# Patient Record
Sex: Female | Born: 1955
Health system: Southern US, Community
[De-identification: ages and names within clinical notes are randomized; demographics above are authoritative.]

## PROBLEM LIST (undated history)

## (undated) DIAGNOSIS — F329 Major depressive disorder, single episode, unspecified: Secondary | ICD-10-CM

## (undated) DIAGNOSIS — F32A Depression, unspecified: Secondary | ICD-10-CM

## (undated) DIAGNOSIS — I10 Essential (primary) hypertension: Secondary | ICD-10-CM

## (undated) DIAGNOSIS — F1010 Alcohol abuse, uncomplicated: Secondary | ICD-10-CM

## (undated) DIAGNOSIS — E7800 Pure hypercholesterolemia, unspecified: Secondary | ICD-10-CM

## (undated) HISTORY — PX: TUBAL LIGATION: SHX77

---

## 2000-04-20 ENCOUNTER — Emergency Department (HOSPITAL_COMMUNITY): Admission: EM | Admit: 2000-04-20 | Discharge: 2000-04-20 | Payer: Self-pay | Admitting: Emergency Medicine

## 2001-08-28 ENCOUNTER — Emergency Department (HOSPITAL_COMMUNITY): Admission: EM | Admit: 2001-08-28 | Discharge: 2001-08-28 | Payer: Self-pay | Admitting: Emergency Medicine

## 2014-11-04 ENCOUNTER — Ambulatory Visit
Admission: RE | Admit: 2014-11-04 | Discharge: 2014-11-04 | Disposition: A | Discharge status: Home / Self Care | Payer: 59 | Source: Ambulatory Visit | Attending: Internal Medicine | Admitting: Internal Medicine

## 2014-11-04 ENCOUNTER — Other Ambulatory Visit: Payer: Self-pay | Admitting: Internal Medicine

## 2014-11-04 DIAGNOSIS — R059 Cough, unspecified: Secondary | ICD-10-CM

## 2014-11-04 DIAGNOSIS — R05 Cough: Secondary | ICD-10-CM

## 2014-11-04 DIAGNOSIS — Z1231 Encounter for screening mammogram for malignant neoplasm of breast: Secondary | ICD-10-CM

## 2014-11-11 ENCOUNTER — Other Ambulatory Visit (HOSPITAL_COMMUNITY)
Admission: RE | Admit: 2014-11-11 | Discharge: 2014-11-11 | Disposition: A | Discharge status: Home / Self Care | Payer: 59 | Source: Ambulatory Visit | Attending: Nurse Practitioner | Admitting: Nurse Practitioner

## 2014-11-11 ENCOUNTER — Other Ambulatory Visit: Payer: Self-pay | Admitting: Nurse Practitioner

## 2014-11-11 DIAGNOSIS — Z1151 Encounter for screening for human papillomavirus (HPV): Secondary | ICD-10-CM | POA: Insufficient documentation

## 2014-11-11 DIAGNOSIS — Z01419 Encounter for gynecological examination (general) (routine) without abnormal findings: Secondary | ICD-10-CM | POA: Insufficient documentation

## 2014-11-12 LAB — CYTOLOGY - PAP

## 2014-11-26 ENCOUNTER — Ambulatory Visit: Payer: 59

## 2014-12-15 ENCOUNTER — Ambulatory Visit: Payer: 59

## 2014-12-15 ENCOUNTER — Ambulatory Visit
Admission: RE | Admit: 2014-12-15 | Discharge: 2014-12-15 | Disposition: A | Discharge status: Home / Self Care | Payer: 59 | Source: Ambulatory Visit | Attending: Internal Medicine | Admitting: Internal Medicine

## 2014-12-15 DIAGNOSIS — Z1231 Encounter for screening mammogram for malignant neoplasm of breast: Secondary | ICD-10-CM

## 2014-12-23 ENCOUNTER — Other Ambulatory Visit: Payer: Self-pay | Admitting: Gastroenterology

## 2015-01-18 ENCOUNTER — Encounter (HOSPITAL_COMMUNITY): Payer: Self-pay | Admitting: Emergency Medicine

## 2015-01-18 ENCOUNTER — Emergency Department (HOSPITAL_COMMUNITY): Payer: BLUE CROSS/BLUE SHIELD

## 2015-01-18 ENCOUNTER — Emergency Department (HOSPITAL_COMMUNITY)
Admission: EM | Admit: 2015-01-18 | Discharge: 2015-01-18 | Disposition: A | Discharge status: Home / Self Care | Payer: BLUE CROSS/BLUE SHIELD | Attending: Emergency Medicine | Admitting: Emergency Medicine

## 2015-01-18 DIAGNOSIS — E7800 Pure hypercholesterolemia, unspecified: Secondary | ICD-10-CM | POA: Insufficient documentation

## 2015-01-18 DIAGNOSIS — R51 Headache: Secondary | ICD-10-CM | POA: Insufficient documentation

## 2015-01-18 DIAGNOSIS — Z79899 Other long term (current) drug therapy: Secondary | ICD-10-CM | POA: Diagnosis not present

## 2015-01-18 DIAGNOSIS — I10 Essential (primary) hypertension: Secondary | ICD-10-CM | POA: Diagnosis not present

## 2015-01-18 DIAGNOSIS — R200 Anesthesia of skin: Secondary | ICD-10-CM | POA: Insufficient documentation

## 2015-01-18 DIAGNOSIS — Z8659 Personal history of other mental and behavioral disorders: Secondary | ICD-10-CM | POA: Diagnosis not present

## 2015-01-18 DIAGNOSIS — E785 Hyperlipidemia, unspecified: Secondary | ICD-10-CM | POA: Insufficient documentation

## 2015-01-18 DIAGNOSIS — F17210 Nicotine dependence, cigarettes, uncomplicated: Secondary | ICD-10-CM | POA: Insufficient documentation

## 2015-01-18 DIAGNOSIS — R519 Headache, unspecified: Secondary | ICD-10-CM

## 2015-01-18 HISTORY — DX: Major depressive disorder, single episode, unspecified: F32.9

## 2015-01-18 HISTORY — DX: Pure hypercholesterolemia, unspecified: E78.00

## 2015-01-18 HISTORY — DX: Alcohol abuse, uncomplicated: F10.10

## 2015-01-18 HISTORY — DX: Essential (primary) hypertension: I10

## 2015-01-18 HISTORY — DX: Depression, unspecified: F32.A

## 2015-01-18 LAB — DIFFERENTIAL
Basophils Absolute: 0 10*3/uL (ref 0.0–0.1)
Basophils Relative: 1 %
Eosinophils Absolute: 0 10*3/uL (ref 0.0–0.7)
Eosinophils Relative: 1 %
Lymphocytes Relative: 38 %
Lymphs Abs: 2.8 10*3/uL (ref 0.7–4.0)
Monocytes Absolute: 0.8 10*3/uL (ref 0.1–1.0)
Monocytes Relative: 11 %
Neutro Abs: 3.6 10*3/uL (ref 1.7–7.7)
Neutrophils Relative %: 49 %

## 2015-01-18 LAB — I-STAT TROPONIN, ED: Troponin i, poc: 0.01 ng/mL (ref 0.00–0.08)

## 2015-01-18 LAB — APTT: aPTT: 29 seconds (ref 24–37)

## 2015-01-18 LAB — PROTIME-INR
INR: 1 (ref 0.00–1.49)
Prothrombin Time: 13.4 seconds (ref 11.6–15.2)

## 2015-01-18 LAB — CBC
HCT: 43 % (ref 36.0–46.0)
Hemoglobin: 14.6 g/dL (ref 12.0–15.0)
MCH: 31.7 pg (ref 26.0–34.0)
MCHC: 34 g/dL (ref 30.0–36.0)
MCV: 93.5 fL (ref 78.0–100.0)
Platelets: 291 10*3/uL (ref 150–400)
RBC: 4.6 MIL/uL (ref 3.87–5.11)
RDW: 14.5 % (ref 11.5–15.5)
WBC: 7.3 10*3/uL (ref 4.0–10.5)

## 2015-01-18 LAB — BLOOD GAS, VENOUS
Acid-base deficit: 3.6 mmol/L — ABNORMAL HIGH (ref 0.0–2.0)
Bicarbonate: 19.3 mEq/L — ABNORMAL LOW (ref 20.0–24.0)
O2 Saturation: 78.3 %
Patient temperature: 98.6
TCO2: 16.8 mmol/L (ref 0–100)
pCO2, Ven: 30.5 mmHg — ABNORMAL LOW (ref 45.0–50.0)
pH, Ven: 7.417 — ABNORMAL HIGH (ref 7.250–7.300)
pO2, Ven: 44.3 mmHg (ref 30.0–45.0)

## 2015-01-18 LAB — COMPREHENSIVE METABOLIC PANEL
ALT: 38 U/L (ref 14–54)
AST: 51 U/L — ABNORMAL HIGH (ref 15–41)
Albumin: 4.2 g/dL (ref 3.5–5.0)
Alkaline Phosphatase: 82 U/L (ref 38–126)
Anion gap: 16 — ABNORMAL HIGH (ref 5–15)
BUN: 8 mg/dL (ref 6–20)
CO2: 19 mmol/L — ABNORMAL LOW (ref 22–32)
Calcium: 9.9 mg/dL (ref 8.9–10.3)
Chloride: 102 mmol/L (ref 101–111)
Creatinine, Ser: 0.72 mg/dL (ref 0.44–1.00)
GFR calc Af Amer: >60 mL/min (ref >60)
GFR calc non Af Amer: >60 mL/min (ref >60)
Glucose, Bld: 104 mg/dL — ABNORMAL HIGH (ref 65–99)
Potassium: 3.7 mmol/L (ref 3.5–5.1)
Sodium: 137 mmol/L (ref 135–145)
Total Bilirubin: 1.2 mg/dL (ref 0.3–1.2)
Total Protein: 8 g/dL (ref 6.5–8.1)

## 2015-01-18 LAB — CBG MONITORING, ED: Glucose-Capillary: 82 mg/dL (ref 65–99)

## 2015-01-18 NOTE — ED Provider Notes (Signed)
Complains of occipital headache and posterior neck pain as well as numbness in left arm onset gradually approximate 7 PM yesterday no chest pain no focal weakness no other associated symptoms she is asymptomatic presently. Cardiac risk factors include hypertension, hypercholesterolemia, smoking otherwise negative on exam alert Glasgow Coma Score 15 HEENT exam no facial asymmetry neck supple trachea midline no bruit lungs clear to auscultation heart regular rate and rhythm no murmurs abdomen obese, nontender extremities no edema skin warm dry neurologic. Glasgow Coma Score 15 cranial nerves II through XII grossly intact gait normal Romberg normal pronator drift normal finger to nose normal. DTRs symmetric I laterally knee jerk and ankle jerk and biceps toes downgoing bilaterally No signs of stroke. Negative head CT scan. Don't feel we need further imaging, as patient had headache with symptoms Doubt acute coronary syndrome. In light of no chest pain, normal EKG, negative troponin  Orlie Dakin, MD 01/18/15 1035

## 2015-01-18 NOTE — ED Provider Notes (Signed)
CSN: [id number]     Arrival date & time 01/18/15  0747 History   First MD Initiated Contact with Patient 01/18/15 0756     Chief Complaint  Patient presents with  . Numbness  . Headache   HPI  Ms. Gong is a 60 year old female with PMHx of HTN and hyperlipidemia presenting with headache and unusual sensation in her left arm. She reports onset of the sensation last evening. She states that it is difficult to describe, it "just feels funny". She states that it "sort of" feels heavy or numb. She denies trauma to the arm. Denies weakness of the left upper extremity. She reports going to bed and waking with the sensation still present. Since waking, the sensation has gradually begun to resolve and she states that she feels almost back to baseline. She does note that she has felt the sensation in the past when she had high blood pressure. She is followed by a primary care for control of her blood pressure. She reports a visit with him one month ago for blood pressure management and was supposed schedule a follow-up appointment but never did. She reports compliance with her blood pressure medicines. She also notes waking up with an occipital headache this morning. She states the headache felt like a pressure. The headache has also begun to resolve upon presentation to the emergency department. She did not take any over-the-counter pain medications for this. She denies a headache history. She denies fevers, chills, dizziness, syncope, vision changes, congestion, sore throat, neck stiffness, chest pain, SOB, abdominal pain, nausea, vomiting, extremity weakness or rashes.   Past Medical History  Diagnosis Date  . Hypertension   . High cholesterol   . Depression   . Alcohol abuse    Past Surgical History  Procedure Laterality Date  . Tubal ligation     History reviewed. No pertinent family history. Social History  Substance Use Topics  . Smoking status: Current Every Day Smoker -- 3.00 packs/day for 0  years    Types: Cigarettes  . Smokeless tobacco: None  . Alcohol Use: 100.8 oz/week    168 Cans of beer per week   OB History    No data available     Review of Systems  Constitutional: Negative for fever.  Eyes: Negative for visual disturbance.  Respiratory: Negative for shortness of breath.   Cardiovascular: Negative for chest pain.  Gastrointestinal: Negative for nausea, vomiting and abdominal pain.  Musculoskeletal: Negative for neck stiffness.  Skin: Negative for rash.  Neurological: Positive for numbness and headaches.  All other systems reviewed and are negative.     Allergies  Review of patient's allergies indicates no known allergies.  Home Medications   Prior to Admission medications   Medication Sig Start Date End Date Taking? Authorizing Provider  simvastatin (ZOCOR) 20 MG tablet Take 20 mg by mouth every evening.   Yes Historical Provider, MD  valsartan (DIOVAN) 160 MG tablet Take 160 mg by mouth daily.   Yes Historical Provider, MD   BP 150/84 mmHg  Pulse 92  Temp(Src) 98.4 F (36.9 C) (Oral)  Resp 35  SpO2 92% Physical Exam  Constitutional: She is oriented to person, place, and time. She appears well-developed and well-nourished. No distress.  HENT:  Head: Normocephalic and atraumatic.  Mouth/Throat: Oropharynx is clear and moist. No oropharyngeal exudate.  Eyes: Conjunctivae and EOM are normal. Pupils are equal, round, and reactive to light. Right eye exhibits no discharge. Left eye exhibits no discharge.  No scleral icterus.  Neck: Normal range of motion. Neck supple.  No meningeal signs  Cardiovascular: Normal rate, regular rhythm and normal heart sounds.   Pulmonary/Chest: Effort normal and breath sounds normal. No respiratory distress. She has no wheezes. She has no rales.  Abdominal: Soft. There is no tenderness.  Musculoskeletal: Normal range of motion.  Pt moves all extremities spontaneously and walks with a steady gait  Neurological: She is  alert and oriented to person, place, and time. No cranial nerve deficit. Coordination normal.  Cranial nerves 3-12 intact. Major muscle groups with 5/5 motor strength. Sensation to light touch intact. Coordinated finger to nose. Walks with a steady gait.   Skin: Skin is warm and dry.  Psychiatric: She has a normal mood and affect. Her behavior is normal.  Nursing note and vitals reviewed.   ED Course  Procedures (including critical care time) Labs Review Labs Reviewed  COMPREHENSIVE METABOLIC PANEL - Abnormal; Notable for the following:    CO2 19 (*)    Glucose, Bld 104 (*)    AST 51 (*)    Anion gap 16 (*)    All other components within normal limits  BLOOD GAS, VENOUS - Abnormal; Notable for the following:    pH, Ven 7.417 (*)    pCO2, Ven 30.5 (*)    Bicarbonate 19.3 (*)    Acid-base deficit 3.6 (*)    All other components within normal limits  PROTIME-INR  APTT  CBC  DIFFERENTIAL  I-STAT TROPOININ, ED  CBG MONITORING, ED    Imaging Review Ct Head Wo Contrast  01/18/2015  CLINICAL DATA:  Left arm weakness starting last p.m. EXAM: CT HEAD WITHOUT CONTRAST TECHNIQUE: Contiguous axial images were obtained from the base of the skull through the vertex without intravenous contrast. COMPARISON:  None. FINDINGS: No skull fracture is noted. No intracranial hemorrhage, mass effect or midline shift. Atherosclerotic calcifications of carotid siphon. Mild cerebral atrophy. There is mild periventricular and patchy subcortical white matter decreased attenuation consistent with chronic small vessel ischemic changes. There is no definite evidence of acute cortical infarction. No mass lesion is noted on this unenhanced scan. IMPRESSION: No acute intracranial abnormality. No definite acute cortical infarction. Mild cerebral atrophy. There is mild periventricular and patchy subcortical white matter decreased attenuation probable due to chronic small vessel ischemic changes. Mild atherosclerotic  calcifications of carotid siphon. Electronically Signed   By: Lahoma Crocker M.D.   On: 01/18/2015 10:04   I have personally reviewed and evaluated these images and lab results as part of my medical decision-making.   EKG Interpretation   Date/Time:  Tuesday January 18 2015 08:00:07 EST Ventricular Rate:  96 PR Interval:  187 QRS Duration: 100 QT Interval:  360 QTC Calculation: 455 R Axis:   70 Text Interpretation:  Sinus rhythm Nonspecific T abnormalities, lateral  leads Baseline wander in lead(s) V3 No old tracing to compare Confirmed by  JACUBOWITZ  MD, SAM [id number]) on 01/18/2015 10:15:02 AM      MDM   Final diagnoses:  Headache, unspecified headache type  Numbness   60 yo female presenting with occipital HA and left arm numbness x 1 day. Symptoms have largely resolved upon presentation. Pt hypertensive to 169/105. She reports this is typical of her BP and she is working with her PCP to get it under control. Non-focal neuro exam. Heart RRR. Lungs CTAB. CT negative. Negative troponin and non-ischemic ECG. On re-assessment, pt reports full resolution of her symptoms.  At this time  there does not appear to be any evidence of an acute emergency medical condition and the patient appears stable for discharge with appropriate outpatient follow up. Diagnosis was discussed with patient who verbalizes understanding and is agreeable to discharge. Discussed importance of following up with her PCP to manage her BP and quit smoking. Pt case discussed with Dr. Winfred Leeds who agrees with my plan. Return precautions given in discharge paperwork and discussed with pt at bedside. Pt is stable for discharge.  Filed Vitals:   01/18/15 0757 01/18/15 1027 01/18/15 1030 01/18/15 1210  BP: 184/106 169/105  150/84  Pulse: 94  88 92  Temp: 98.4 F (36.9 C)     TempSrc: Oral     Resp: 18  18 35  SpO2: 100%  100% 92%       Josephina Gip, PA-C 01/18/15 Geraldine, MD 01/19/15 [phone]

## 2015-01-18 NOTE — ED Notes (Signed)
Pt states she felt "kind of funny," with some tingling in her left arm. Woke up today with a headache and worsening tingling of the arm. Denies CP, SOB, N/V/D. Able to feel both arms equally, states some of the numbness and headache has subsided at this time.

## 2015-01-18 NOTE — Discharge Instructions (Signed)
Call your PCP (Dr. Lysle Rubens) for a follow up visit. Your blood pressure was high today and it is importance to get it rechecked by your PCP for possible medication changes.    General Headache Without Cause A headache is pain or discomfort felt around the head or neck area. The specific cause of a headache may not be found. There are many causes and types of headaches. A few common ones are:  Tension headaches.  Migraine headaches.  Cluster headaches.  Chronic daily headaches. HOME CARE INSTRUCTIONS  Watch your condition for any changes. Take these steps to help with your condition: Managing Pain  Take over-the-counter and prescription medicines only as told by your health care provider.  Lie down in a dark, quiet room when you have a headache.  If directed, apply ice to the head and neck area:  Put ice in a plastic bag.  Place a towel between your skin and the bag.  Leave the ice on for 20 minutes, 2-3 times per day.  Use a heating pad or hot shower to apply heat to the head and neck area as told by your health care provider.  Keep lights dim if bright lights bother you or make your headaches worse. Eating and Drinking  Eat meals on a regular schedule.  Limit alcohol use.  Decrease the amount of caffeine you drink, or stop drinking caffeine. General Instructions  Keep all follow-up visits as told by your health care provider. This is important.  Keep a headache journal to help find out what may trigger your headaches. For example, write down:  What you eat and drink.  How much sleep you get.  Any change to your diet or medicines.  Try massage or other relaxation techniques.  Limit stress.  Sit up straight, and do not tense your muscles.  Do not use tobacco products, including cigarettes, chewing tobacco, or e-cigarettes. If you need help quitting, ask your health care provider.  Exercise regularly as told by your health care provider.  Sleep on a regular  schedule. Get 7-9 hours of sleep, or the amount recommended by your health care provider. SEEK MEDICAL CARE IF:   Your symptoms are not helped by medicine.  You have a headache that is different from the usual headache.  You have nausea or you vomit.  You have a fever. SEEK IMMEDIATE MEDICAL CARE IF:   Your headache becomes severe.  You have repeated vomiting.  You have a stiff neck.  You have a loss of vision.  You have problems with speech.  You have pain in the eye or ear.  You have muscular weakness or loss of muscle control.  You lose your balance or have trouble walking.  You feel faint or pass out.  You have confusion.   This information is not intended to replace advice given to you by your health care provider. Make sure you discuss any questions you have with your health care provider.   Document Released: 12/25/2004 Document Revised: 09/15/2014 Document Reviewed: 04/19/2014 Elsevier Interactive Patient Education [employer].

## 2015-01-18 NOTE — ED Provider Notes (Signed)
counciled pt for 5 minutes on smoking cessation  Orlie Dakin, MD 01/18/15 1041

## 2015-02-11 ENCOUNTER — Encounter (HOSPITAL_COMMUNITY): Payer: Self-pay | Admitting: *Deleted

## 2015-02-22 ENCOUNTER — Ambulatory Visit (HOSPITAL_COMMUNITY)
Admission: RE | Admit: 2015-02-22 | Payer: BLUE CROSS/BLUE SHIELD | Source: Ambulatory Visit | Admitting: Gastroenterology

## 2015-02-22 SURGERY — COLONOSCOPY WITH PROPOFOL
Anesthesia: Monitor Anesthesia Care

## 2015-08-12 ENCOUNTER — Other Ambulatory Visit: Payer: Self-pay | Admitting: Gastroenterology

## 2015-09-16 ENCOUNTER — Encounter (HOSPITAL_COMMUNITY): Payer: Self-pay | Admitting: *Deleted

## 2015-09-19 ENCOUNTER — Other Ambulatory Visit: Payer: Self-pay | Admitting: Gastroenterology

## 2015-09-19 NOTE — Anesthesia Preprocedure Evaluation (Addendum)
Anesthesia Evaluation  Patient identified by MRN, date of birth, ID band Patient awake    Reviewed: Allergy & Precautions, NPO status , Patient's Chart, lab work & pertinent test results  Airway Mallampati: II  TM Distance: >3 FB     Dental   Pulmonary Current Smoker,    breath sounds clear to auscultation       Cardiovascular hypertension, Pt. on medications  Rhythm:Regular Rate:Normal     Neuro/Psych Depression negative neurological ROS     GI/Hepatic negative GI ROS, Neg liver ROS,   Endo/Other  negative endocrine ROS  Renal/GU negative Renal ROS     Musculoskeletal   Abdominal   Peds  Hematology negative hematology ROS (+)   Anesthesia Other Findings   Reproductive/Obstetrics                            Anesthesia Physical Anesthesia Plan  ASA: II  Anesthesia Plan: MAC   Post-op Pain Management:    Induction: Intravenous  Airway Management Planned: Natural Airway and Nasal Cannula  Additional Equipment:   Intra-op Plan:   Post-operative Plan:   Informed Consent: I have reviewed the patients History and Physical, chart, labs and discussed the procedure including the risks, benefits and alternatives for the proposed anesthesia with the patient or authorized representative who has indicated his/her understanding and acceptance.     Plan Discussed with:   Anesthesia Plan Comments:        Anesthesia Quick Evaluation

## 2015-09-20 ENCOUNTER — Ambulatory Visit (HOSPITAL_COMMUNITY)
Admission: RE | Admit: 2015-09-20 | Discharge: 2015-09-20 | Disposition: A | Discharge status: Home / Self Care | Payer: BLUE CROSS/BLUE SHIELD | Source: Ambulatory Visit | Attending: Gastroenterology | Admitting: Gastroenterology

## 2015-09-20 ENCOUNTER — Encounter (HOSPITAL_COMMUNITY)
Admission: RE | Disposition: A | Discharge status: Home / Self Care | Payer: Self-pay | Source: Ambulatory Visit | Attending: Gastroenterology

## 2015-09-20 ENCOUNTER — Ambulatory Visit (HOSPITAL_COMMUNITY): Payer: BLUE CROSS/BLUE SHIELD | Admitting: Anesthesiology

## 2015-09-20 ENCOUNTER — Encounter (HOSPITAL_COMMUNITY): Payer: Self-pay

## 2015-09-20 DIAGNOSIS — F17210 Nicotine dependence, cigarettes, uncomplicated: Secondary | ICD-10-CM | POA: Diagnosis not present

## 2015-09-20 DIAGNOSIS — D120 Benign neoplasm of cecum: Secondary | ICD-10-CM | POA: Insufficient documentation

## 2015-09-20 DIAGNOSIS — K635 Polyp of colon: Secondary | ICD-10-CM | POA: Diagnosis not present

## 2015-09-20 DIAGNOSIS — E7800 Pure hypercholesterolemia, unspecified: Secondary | ICD-10-CM | POA: Insufficient documentation

## 2015-09-20 DIAGNOSIS — Z1211 Encounter for screening for malignant neoplasm of colon: Secondary | ICD-10-CM | POA: Insufficient documentation

## 2015-09-20 DIAGNOSIS — I10 Essential (primary) hypertension: Secondary | ICD-10-CM | POA: Diagnosis not present

## 2015-09-20 HISTORY — PX: COLONOSCOPY WITH PROPOFOL: SHX5780

## 2015-09-20 SURGERY — COLONOSCOPY WITH PROPOFOL
Anesthesia: Monitor Anesthesia Care

## 2015-09-20 MED ORDER — PROPOFOL 10 MG/ML IV BOLUS
INTRAVENOUS | Status: AC
  Filled 2015-09-20: qty 20

## 2015-09-20 MED ORDER — PROPOFOL 500 MG/50ML IV EMUL
INTRAVENOUS | Status: DC | PRN
  Administered 2015-09-20: 50 ug/kg/min via INTRAVENOUS

## 2015-09-20 MED ORDER — SODIUM CHLORIDE 0.9 % IV SOLN: INTRAVENOUS | Status: DC

## 2015-09-20 MED ORDER — LACTATED RINGERS IV SOLN
INTRAVENOUS | Status: DC
  Administered 2015-09-20: 1000 mL via INTRAVENOUS

## 2015-09-20 MED ORDER — PROPOFOL 10 MG/ML IV BOLUS
INTRAVENOUS | Status: AC
  Filled 2015-09-20: qty 40

## 2015-09-20 MED ORDER — ONDANSETRON HCL 4 MG/2ML IJ SOLN
INTRAMUSCULAR | Status: AC
Start: 2015-09-20 — End: 2015-09-20
  Filled 2015-09-20: qty 2

## 2015-09-20 MED ORDER — ONDANSETRON HCL 4 MG/2ML IJ SOLN
INTRAMUSCULAR | Status: DC | PRN
  Administered 2015-09-20: 4 mg via INTRAVENOUS

## 2015-09-20 MED ORDER — LIDOCAINE 2% (20 MG/ML) 5 ML SYRINGE
INTRAMUSCULAR | Status: AC
Start: 2015-09-20 — End: 2015-09-20
  Filled 2015-09-20: qty 5

## 2015-09-20 SURGICAL SUPPLY — 22 items

## 2015-09-20 NOTE — Anesthesia Procedure Notes (Signed)
Procedure Name: MAC Date/Time: 09/20/2015 7:30 AM Performed by: West Pugh Pre-anesthesia Checklist: Patient identified, Timeout performed, Emergency Drugs available, Suction available and Patient being monitored Patient Re-evaluated:Patient Re-evaluated prior to inductionOxygen Delivery Method: Simple face mask Dental Injury: Teeth and Oropharynx as per pre-operative assessment

## 2015-09-20 NOTE — H&P (Signed)
  Procedure: Baseline screening colonoscopy  History: The patient is a 60 year old female born 04/08/1955. She is scheduled to undergo her first screening colonoscopy with polypectomy to prevent colon cancer. There is no family history of colon cancer.  Medication allergies: Penicillin caused itching. Lisinopril caused nausea.  Past medical history: Bilateral tubal ligation. Hypertension. Hypercholesterolemia.  Habits: The patient currently smokes cigarettes.  Exam: The patient is alert and lying comfortably on the endoscopy stretcher. Abdomen is soft and nontender to palpation. Lungs are clear to auscultation. Cardiac exam reveals a regular rhythm.  Plan: Proceed with screening colonoscopy

## 2015-09-20 NOTE — Discharge Instructions (Signed)

## 2015-09-20 NOTE — Anesthesia Postprocedure Evaluation (Signed)
Anesthesia Post Note  Patient: Tiffany Frye  Procedure(s) Performed: Procedure(s) (LRB): COLONOSCOPY WITH PROPOFOL (N/A)  Patient location during evaluation: PACU Anesthesia Type: MAC Level of consciousness: awake and alert Pain management: pain level controlled Vital Signs Assessment: post-procedure vital signs reviewed and stable Respiratory status: spontaneous breathing, nonlabored ventilation, respiratory function stable and patient connected to nasal cannula oxygen Cardiovascular status: stable and blood pressure returned to baseline Anesthetic complications: no    Last Vitals:  Vitals:   09/20/15 0806 09/20/15 0820  BP: 133/72 (!) 145/83  Pulse: 83   Resp: (!) 29   Temp: 36.7 C     Last Pain:  Vitals:   09/20/15 0620  TempSrc: Leonard Schwartz

## 2015-09-20 NOTE — Transfer of Care (Signed)
Immediate Anesthesia Transfer of Care Note  Patient: Tiffany Frye  Procedure(s) Performed: Procedure(s): COLONOSCOPY WITH PROPOFOL (N/A)  Patient Location: PACU  Anesthesia Type:MAC  Level of Consciousness:  sedated, patient cooperative and responds to stimulation  Airway & Oxygen Therapy:Patient Spontanous Breathing and Patient connected to face mask oxgen  Post-op Assessment:  Report given to PACU RN and Post -op Vital signs reviewed and stable  Post vital signs:  Reviewed and stable  Last Vitals:  Vitals:   09/20/15 0620 09/20/15 0806  BP: (!) 146/63 133/72  Pulse: 90 83  Resp: 14 (!) 29  Temp: 36.9 C [id number] C    Complications: No apparent anesthesia complications

## 2015-09-20 NOTE — Op Note (Signed)
[facility] Patient Name: Tiffany Frye Procedure Date: 09/20/2015 MRN: [id number] Attending MD: Garlan Fair , MD Date of Birth: 26-Jun-1955 CSN: [id number] Age: 60 Admit Type: Outpatient Procedure:                Colonoscopy Indications:              Screening for colorectal malignant neoplasm Providers:                Garlan Fair, MD, Elmer Ramp. Hinson, RN, [employer], Technician, [employer], CRNA, Rhae Lerner, CRNA Referring MD:              Medicines:                Propofol per Anesthesia Complications:            No immediate complications. Estimated Blood Loss:     Estimated blood loss: none. Procedure:                Pre-Anesthesia Assessment:                           - Prior to the procedure, a History and Physical                            was performed, and patient medications and                            allergies were reviewed. The patient's tolerance of                            previous anesthesia was also reviewed. The risks                            and benefits of the procedure and the sedation                            options and risks were discussed with the patient.                            All questions were answered, and informed consent                            was obtained. Prior Anticoagulants: The patient has                            taken no previous anticoagulant or antiplatelet                            agents. ASA Grade Assessment: II - A patient with  mild systemic disease. After reviewing the risks                            and benefits, the patient was deemed in                            satisfactory condition to undergo the procedure.                           After obtaining informed consent, the colonoscope                            was passed under direct vision. Throughout the                            procedure, the  patient's blood pressure, pulse, and                            oxygen saturations were monitored continuously. The                            was introduced through the anus and advanced to the                            the cecum, identified by appendiceal orifice and                            ileocecal valve. The colonoscopy was somewhat                            difficult due to significant looping. The patient                            tolerated the procedure well. The quality of the                            bowel preparation was good. The ileocecal valve,                            the appendiceal orifice and the rectum were                            photographed. Scope In: 7:36:20 AM Scope Out: 7:58:18 AM Scope Withdrawal Time: 0 hours 12 minutes 23 seconds  Total Procedure Duration: 0 hours 21 minutes 58 seconds  Findings:      The perianal and digital rectal examinations were normal.      A 3 mm polyp was found in the cecum. The polyp was sessile. The polyp       was removed with a cold biopsy forceps. Resection and retrieval were       complete.      A 5 mm polyp was found in the proximal descending colon. The polyp was       sessile. The polyp was removed with a cold snare. Resection and  retrieval were complete.      The exam was otherwise without abnormality. Impression:               - One 3 mm polyp in the cecum, removed with a cold                            biopsy forceps. Resected and retrieved.                           - One 5 mm polyp in the proximal descending colon,                            removed with a cold snare. Resected and retrieved.                           - The examination was otherwise normal. Moderate Sedation:      N/A- Per Anesthesia Care Recommendation:           - Patient has a contact number available for                            emergencies. The signs and symptoms of potential                            delayed complications  were discussed with the                            patient. Return to normal activities tomorrow.                            Written discharge instructions were provided to the                            patient.                           - Repeat colonoscopy date to be determined after                            pending pathology results are reviewed for                            surveillance.                           - Resume previous diet.                           - Continue present medications. Procedure Code(s):        --- Professional ---                           [phone], Colonoscopy, flexible; with removal of                            tumor(s), polyp(s), or other lesion(s)  by snare                            technique                           45380, 59, Colonoscopy, flexible; with biopsy,                            single or multiple Diagnosis Code(s):        --- Professional ---                           Z12.11, Encounter for screening for malignant                            neoplasm of colon                           D12.0, Benign neoplasm of cecum                           D12.4, Benign neoplasm of descending colon CPT copyright 2016 American Medical Association. All rights reserved. The codes documented in this report are preliminary and upon coder review may  be revised to meet current compliance requirements. Earle Gell, MD Garlan Fair, MD 09/20/2015 8:05:38 AM This report has been signed electronically. Number of Addenda: 0

## 2015-10-05 ENCOUNTER — Emergency Department (HOSPITAL_COMMUNITY): Payer: BLUE CROSS/BLUE SHIELD

## 2015-10-05 ENCOUNTER — Encounter (HOSPITAL_COMMUNITY): Payer: Self-pay | Admitting: *Deleted

## 2015-10-05 ENCOUNTER — Emergency Department (HOSPITAL_COMMUNITY)
Admission: EM | Admit: 2015-10-05 | Discharge: 2015-10-05 | Disposition: A | Discharge status: Home / Self Care | Payer: BLUE CROSS/BLUE SHIELD | Attending: Emergency Medicine | Admitting: Emergency Medicine

## 2015-10-05 DIAGNOSIS — F17210 Nicotine dependence, cigarettes, uncomplicated: Secondary | ICD-10-CM | POA: Insufficient documentation

## 2015-10-05 DIAGNOSIS — Z79899 Other long term (current) drug therapy: Secondary | ICD-10-CM | POA: Insufficient documentation

## 2015-10-05 DIAGNOSIS — R05 Cough: Secondary | ICD-10-CM | POA: Diagnosis present

## 2015-10-05 DIAGNOSIS — J069 Acute upper respiratory infection, unspecified: Secondary | ICD-10-CM

## 2015-10-05 DIAGNOSIS — I10 Essential (primary) hypertension: Secondary | ICD-10-CM | POA: Insufficient documentation

## 2015-10-05 DIAGNOSIS — R059 Cough, unspecified: Secondary | ICD-10-CM

## 2015-10-05 MED ORDER — BENZONATATE 100 MG PO CAPS: 100.0000 mg | ORAL_CAPSULE | Freq: Three times a day (TID) | ORAL | 0 refills | Status: DC

## 2015-10-05 NOTE — ED Notes (Signed)
Patient transported to X-ray 

## 2015-10-05 NOTE — ED Triage Notes (Addendum)
Patient c/o cough productive of yellow sputum and nasal congestion x2 weeks.  Patient states cough is worse upon waking.  Patient also c/o post tussive emesis.  Patient denies diarrhea and fever.  Patient denies sinus pain, sore throat and ear ache.  Patient has not treated s/s with OTC medications.

## 2015-10-05 NOTE — ED Provider Notes (Signed)
[facility] DEPT Provider Note   CSN: [id number] Arrival date & time: 10/05/15  1300  By signing my name below, I, Jeanell Sparrow, attest that this documentation has been prepared under the direction and in the presence of non-physician practitioner, Janetta Hora, PA-C. Electronically Signed: Jeanell Sparrow, Scribe. 10/05/2015. 2:08 PM.  History   Chief Complaint Chief Complaint  Patient presents with  . Cough  . Nasal Congestion   The history is provided by the patient. No language interpreter was used.   HPI Comments: Tiffany Frye is a 60 y.o. female who presents to the Emergency Department complaining of intermittent moderate cough that started about a couple of weeks ago. Pt states the gradually worsening cough is productive of yellow sputum. Took dayquil 3 days ago with some relief. Associated symptoms include  fever (last week which has resolved), fatigue, appetite loss, nasal congestion/rhinorrhea, headache, mild SOB, post-tussive emesis. Pt's temperature in the ED today was 97.9. She has a hx of daily smoking. Reports no sick contacts or recent travel. Denies any wheezing, ear pain, sore throat, sinus pain, or abdominal pain.   Past Medical History:  Diagnosis Date  . Alcohol abuse   . Depression   . High cholesterol   . Hypertension     There are no active problems to display for this patient.   Past Surgical History:  Procedure Laterality Date  . COLONOSCOPY WITH PROPOFOL N/A 09/20/2015   Procedure: COLONOSCOPY WITH PROPOFOL;  Surgeon: Garlan Fair, MD;  Location: [facility];  Service: Endoscopy;  Laterality: N/A;  . TUBAL LIGATION      OB History    No data available       Home Medications    Prior to Admission medications   Medication Sig Start Date End Date Taking? Authorizing Provider  simvastatin (ZOCOR) 20 MG tablet Take 20 mg by mouth every evening.    Historical Provider, MD  valsartan (DIOVAN) 160 MG tablet Take 160 mg by mouth daily.     Historical Provider, MD    Family History No family history on file.  Social History Social History  Substance Use Topics  . Smoking status: Current Every Day Smoker    Packs/day: 3.00    Years: 0.00    Types: Cigarettes  . Smokeless tobacco: Never Used  . Alcohol use 100.8 oz/week    168 Cans of beer per week     Comment: decrease to 2-3 beers per week     Allergies   Penicillins   Review of Systems Review of Systems  Constitutional: Positive for appetite change and fatigue. Negative for fever.  HENT: Positive for congestion (Nasal). Negative for ear pain, sinus pressure and sore throat.   Respiratory: Positive for cough and shortness of breath. Negative for wheezing.   Gastrointestinal: Positive for vomiting (Post-tussive). Negative for abdominal pain.  Neurological: Positive for headaches. Negative for dizziness and light-headedness.     Physical Exam Updated Vital Signs BP 111/67 (BP Location: Right Arm)   Pulse 75   Temp 97.9 F (36.6 C) (Oral)   Resp 18   Ht 5\' 3"  (1.6 m)   Wt 152 lb (68.9 kg)   SpO2 99%   BMI 26.93 kg/m   Physical Exam  Constitutional: She is oriented to person, place, and time. She appears well-developed and well-nourished. No distress.  HENT:  Head: Normocephalic and atraumatic.  Right Ear: Hearing, tympanic membrane, external ear and ear canal normal.  Left Ear: Hearing, tympanic membrane, external ear and ear  canal normal.  Nose: Rhinorrhea present. Right sinus exhibits no maxillary sinus tenderness and no frontal sinus tenderness. Left sinus exhibits no maxillary sinus tenderness and no frontal sinus tenderness.  Mouth/Throat: Uvula is midline and mucous membranes are normal. No oropharyngeal exudate, posterior oropharyngeal edema, posterior oropharyngeal erythema or tonsillar abscesses.  Eyes: Conjunctivae are normal. Pupils are equal, round, and reactive to light. Right eye exhibits no discharge. Left eye exhibits no discharge. No  scleral icterus.  Neck: Normal range of motion. Neck supple.  Cardiovascular: Normal rate and regular rhythm.  Exam reveals no gallop and no friction rub.   No murmur heard. Pulmonary/Chest: Effort normal. No respiratory distress. She has no decreased breath sounds. She has no wheezes. She has rhonchi. She has no rales. She exhibits no tenderness.  Scattered rhonchi  Abdominal: Soft. She exhibits no distension.  Musculoskeletal: She exhibits no edema.  Neurological: She is alert and oriented to person, place, and time.  Skin: Skin is warm and dry.  Psychiatric: She has a normal mood and affect. Her behavior is normal.  Nursing note and vitals reviewed.    ED Treatments / Results  DIAGNOSTIC STUDIES: Oxygen Saturation is 99% on RA, normal by my interpretation.    COORDINATION OF CARE: 2:12 PM- Pt advised of plan for treatment and pt agrees.  Labs (all labs ordered are listed, but only abnormal results are displayed) Labs Reviewed - No data to display  EKG  EKG Interpretation None       Radiology Dg Chest 2 View  Result Date: 10/05/2015 CLINICAL DATA:  60 year old presenting with 2 week history of productive cough and sinonasal congestion. More recent onset of nausea and fever. EXAM: CHEST  2 VIEW COMPARISON:  11/04/2014. FINDINGS: Cardiac silhouette normal in size, unchanged. Thoracic aorta mildly atherosclerotic, unchanged. Hilar and mediastinal contours otherwise unremarkable. Lungs clear. Bronchovascular markings normal. Pulmonary vascularity normal. No visible pleural effusions. No pneumothorax. Mild degenerative changes involving the thoracic spine. No interval change. IMPRESSION: 1.  No acute cardiopulmonary disease.  Stable examination. 2. Mild thoracic aortic atherosclerosis. Electronically Signed   By: Evangeline Dakin M.D.   On: 10/05/2015 15:03    Procedures Procedures (including critical care time)  Medications Ordered in ED Medications - No data to  display   Initial Impression / Assessment and Plan / ED Course  I have reviewed the triage vital signs and the nursing notes.  Pertinent labs & imaging results that were available during my care of the patient were reviewed by me and considered in my medical decision making (see chart for details).  Clinical Course   60 year old female presents with URI symptoms and ongoing cough. Patient is afebrile, not tachycardic or tachypneic, and not hypoxic. BP is on the low side however patient denies lightheadedness/LOC. CXR negative. Will d/c with Tessalon and advised to continue supportive care measures at home. Also advised smoking cessation or at least to cut back. PCP follow up advised. Patient is NAD, non-toxic, with stable VS. Patient is informed of clinical course, understands medical decision making process, and agrees with plan. Opportunity for questions provided and all questions answered. Return precautions given.   Final Clinical Impressions(s) / ED Diagnoses   Final diagnoses:  URI (upper respiratory infection)  Cough    New Prescriptions Discharge Medication List as of 10/05/2015  3:17 PM    START taking these medications   Details  benzonatate (TESSALON) 100 MG capsule Take 1 capsule (100 mg total) by mouth every 8 (eight)  hours., Starting Wed 10/05/2015, Print       I personally performed the services described in this documentation, which was scribed in my presence. The recorded information has been reviewed and is accurate.     Recardo Evangelist, PA-C 10/05/15 1603    Carmin Muskrat, MD 10/05/15 [phone]

## 2015-10-05 NOTE — ED Notes (Signed)
Discharge instructions, follow up care, and rx x1 reviewed with patient. Patient verbalized understanding. 

## 2015-10-05 NOTE — Discharge Instructions (Signed)
Please continue to take over the counter cold and flu medicine as needed. You can take the cough medicine as well. Try to cut back on smoking as much as possible to help with your cough.

## 2015-10-05 NOTE — ED Notes (Signed)
Patient given sprite and [place] sandwich per PA request.

## 2015-12-27 ENCOUNTER — Encounter (HOSPITAL_COMMUNITY): Payer: Self-pay

## 2015-12-27 ENCOUNTER — Emergency Department (HOSPITAL_COMMUNITY)
Admission: EM | Admit: 2015-12-27 | Discharge: 2015-12-27 | Disposition: A | Discharge status: Home / Self Care | Payer: BLUE CROSS/BLUE SHIELD | Attending: Emergency Medicine | Admitting: Emergency Medicine

## 2015-12-27 DIAGNOSIS — R55 Syncope and collapse: Secondary | ICD-10-CM | POA: Insufficient documentation

## 2015-12-27 DIAGNOSIS — F17210 Nicotine dependence, cigarettes, uncomplicated: Secondary | ICD-10-CM | POA: Diagnosis not present

## 2015-12-27 DIAGNOSIS — E871 Hypo-osmolality and hyponatremia: Secondary | ICD-10-CM

## 2015-12-27 DIAGNOSIS — Z79899 Other long term (current) drug therapy: Secondary | ICD-10-CM | POA: Insufficient documentation

## 2015-12-27 DIAGNOSIS — I959 Hypotension, unspecified: Secondary | ICD-10-CM

## 2015-12-27 LAB — URINALYSIS, ROUTINE W REFLEX MICROSCOPIC
Bilirubin Urine: NEGATIVE
Glucose, UA: NEGATIVE mg/dL
Hgb urine dipstick: NEGATIVE
Ketones, ur: NEGATIVE mg/dL
Leukocytes, UA: NEGATIVE
Nitrite: NEGATIVE
Protein, ur: NEGATIVE mg/dL
Specific Gravity, Urine: 1.008 (ref 1.005–1.030)
pH: 5 (ref 5.0–8.0)

## 2015-12-27 LAB — BASIC METABOLIC PANEL
Anion gap: 12 (ref 5–15)
BUN: 6 mg/dL (ref 6–20)
CO2: 22 mmol/L (ref 22–32)
Calcium: 9.5 mg/dL (ref 8.9–10.3)
Chloride: 95 mmol/L — ABNORMAL LOW (ref 101–111)
Creatinine, Ser: 0.87 mg/dL (ref 0.44–1.00)
GFR calc Af Amer: >60 mL/min (ref >60)
GFR calc non Af Amer: >60 mL/min (ref >60)
Glucose, Bld: 105 mg/dL — ABNORMAL HIGH (ref 65–99)
Potassium: 3.7 mmol/L (ref 3.5–5.1)
Sodium: 129 mmol/L — ABNORMAL LOW (ref 135–145)

## 2015-12-27 LAB — CBC
HCT: 34.4 % — ABNORMAL LOW (ref 36.0–46.0)
Hemoglobin: 12.3 g/dL (ref 12.0–15.0)
MCH: 31.3 pg (ref 26.0–34.0)
MCHC: 35.8 g/dL (ref 30.0–36.0)
MCV: 87.5 fL (ref 78.0–100.0)
Platelets: 228 10*3/uL (ref 150–400)
RBC: 3.93 MIL/uL (ref 3.87–5.11)
RDW: 12.3 % (ref 11.5–15.5)
WBC: 8.2 10*3/uL (ref 4.0–10.5)

## 2015-12-27 MED ORDER — SODIUM CHLORIDE 0.9 % IV BOLUS (SEPSIS)
1000.0000 mL | Freq: Once | INTRAVENOUS | Status: AC
  Administered 2015-12-27: 1000 mL via INTRAVENOUS

## 2015-12-27 NOTE — ED Notes (Signed)
Patient undressed, in gown, on monitor, continuous pulse oximetry and blood pressure cuff; visitor at bedside 

## 2015-12-27 NOTE — ED Notes (Signed)
Patient has returned from the bathroom; patient placed back on monitor, continuous pulse oximetry and blood pressure cuff; visitor at bedsider

## 2015-12-27 NOTE — ED Provider Notes (Signed)
[facility] DEPT Provider Note   CSN: [id number] Arrival date & time: 12/27/15  0825     History   Chief Complaint Chief Complaint  Patient presents with  . Loss of Consciousness    HPI Tiffany Frye is a 60 y.o. female who presents with a syncopal episode. PMH significant for HTN, HLD, current tobacco use. She states that last night at about 10PM she was getting ready for bed. She was sitting and stood up and felt lightheaded. She passed out which was unwitnessed. Denies head injury. Her family members came to her assistance and state she was "mumbling". She eventually got up and went back to bed and came to the ED this morning per her family's request. Currently she denies any pain. No headache, chest pain, SOB, abdominal pain. She reports over the past several months she has had generalized weakness. She also endorses a 60# weight loss over the past 1-2 years. She is on blood pressure medicine but did not take it this morning. No cardiac hx.   HPI  Past Medical History:  Diagnosis Date  . Alcohol abuse   . Depression   . High cholesterol   . Hypertension     There are no active problems to display for this patient.   Past Surgical History:  Procedure Laterality Date  . COLONOSCOPY WITH PROPOFOL N/A 09/20/2015   Procedure: COLONOSCOPY WITH PROPOFOL;  Surgeon: Garlan Fair, MD;  Location: [facility];  Service: Endoscopy;  Laterality: N/A;  . TUBAL LIGATION      OB History    No data available       Home Medications    Prior to Admission medications   Medication Sig Start Date End Date Taking? Authorizing Provider  benzonatate (TESSALON) 100 MG capsule Take 1 capsule (100 mg total) by mouth every 8 (eight) hours. 10/05/15   Recardo Evangelist, PA-C  simvastatin (ZOCOR) 20 MG tablet Take 20 mg by mouth every evening.    Historical Provider, MD  valsartan (DIOVAN) 160 MG tablet Take 160 mg by mouth daily.    Historical Provider, MD    Family History No family  history on file.  Social History Social History  Substance Use Topics  . Smoking status: Current Every Day Smoker    Packs/day: 3.00    Years: 0.00    Types: Cigarettes  . Smokeless tobacco: Never Used  . Alcohol use 100.8 oz/week    168 Cans of beer per week     Comment: decrease to 2-3 beers per week     Allergies   Penicillins   Review of Systems Review of Systems  Constitutional: Positive for appetite change. Negative for chills and fever.  Respiratory: Positive for shortness of breath. Negative for cough.   Cardiovascular: Negative for chest pain, palpitations and leg swelling.  Gastrointestinal: Negative for abdominal pain, blood in stool, nausea and vomiting.  Neurological: Positive for syncope, weakness and light-headedness. Negative for headaches.  All other systems reviewed and are negative.    Physical Exam Updated Vital Signs BP 94/75 (BP Location: Right Arm)   Pulse 76   Temp 97.5 F (36.4 C) (Oral)   Resp 18   Ht 5\' 3"  (1.6 m)   Wt 64.4 kg   SpO2 99%   BMI 25.15 kg/m   Physical Exam  Constitutional: She is oriented to person, place, and time. She appears well-developed and well-nourished. No distress.  HENT:  Head: Normocephalic and atraumatic.  Eyes: Conjunctivae are normal. Pupils are  equal, round, and reactive to light. Right eye exhibits no discharge. Left eye exhibits no discharge. No scleral icterus.  Neck: Normal range of motion.  Cardiovascular: Normal rate and regular rhythm.  Exam reveals no gallop and no friction rub.   No murmur heard. No carotid bruit  Pulmonary/Chest: Effort normal. No respiratory distress. She has decreased breath sounds. She has no wheezes. She has no rales. She exhibits no tenderness.  Abdominal: Soft. Bowel sounds are normal. She exhibits no distension and no mass. There is no tenderness. There is no rebound and no guarding. No hernia.  Neurological: She is alert and oriented to person, place, and time.  Lying on  stretcher in NAD. GCS 15. Speaks in a clear voice. Cranial nerves II through XII grossly intact. 5/5 strength in all extremities. Sensation fully intact.  Bilateral finger-to-nose intact.    Skin: Skin is warm and dry.  Psychiatric: She has a normal mood and affect. Her behavior is normal.  Nursing note and vitals reviewed.    ED Treatments / Results  Labs (all labs ordered are listed, but only abnormal results are displayed) Labs Reviewed  BASIC METABOLIC PANEL - Abnormal; Notable for the following:       Result Value   Sodium 129 (*)    Chloride 95 (*)    Glucose, Bld 105 (*)    All other components within normal limits  CBC - Abnormal; Notable for the following:    HCT 34.4 (*)    All other components within normal limits  URINALYSIS, ROUTINE W REFLEX MICROSCOPIC  CBG MONITORING, ED    EKG  EKG Interpretation  Date/Time:  Tuesday December 27 2015 08:47:34 EST Ventricular Rate:  75 PR Interval:    QRS Duration: 120 QT Interval:  413 QTC Calculation: 462 R Axis:   71 Text Interpretation:  Sinus rhythm Nonspecific intraventricular conduction delay No acute changes No significant change since last tracing Confirmed by Kathrynn Humble, MD, Thelma Comp [id number]) on 12/27/2015 8:53:57 AM       Radiology No results found.  Procedures Procedures (including critical care time)  Medications Ordered in ED Medications  sodium chloride 0.9 % bolus 1,000 mL (0 mLs Intravenous Stopped 12/27/15 1127)     Initial Impression / Assessment and Plan / ED Course  I have reviewed the triage vital signs and the nursing notes.  Pertinent labs & imaging results that were available during my care of the patient were reviewed by me and considered in my medical decision making (see chart for details).  Clinical Course    60 year old female with syncopal episode. Most likely etiology is orthostatic hypotension. BP is low on arrival to ED and she has not taken her BP meds today. She also has had a  significant amount of weight loss over the past year and her medicines have not been adjusted accordingly. 1L NS IVF given with improvement in BP. Orthostatic BP are negative however.  CBC unremarkable. BMP remarkable for hyponatremia (129) and hypochloremia (95). UA clean.  Vital are otherwise WNL. No events on monitor. EKG is SR without a significant change from prior. Advised to hold BP med until she follows up with her PCP. Patient and daughter verbalized understanding. Patient is NAD, non-toxic, with stable VS. Patient is informed of clinical course, understands medical decision making process, and agrees with plan. Opportunity for questions provided and all questions answered. Return precautions given.  Final Clinical Impressions(s) / ED Diagnoses   Final diagnoses:  Syncope, unspecified syncope  type  Hypotension, unspecified hypotension type  Hyponatremia    New Prescriptions New Prescriptions   No medications on file     Recardo Evangelist, PA-C 12/30/15 [place], MD 01/06/16 [phone]

## 2015-12-27 NOTE — ED Notes (Signed)
Pt given po fluids and [place] tray.

## 2015-12-27 NOTE — ED Triage Notes (Signed)
Pt states passed out last night on standing.  Denies pain, SHOB or dizziness. Hx of similar episodes in last few months.

## 2015-12-27 NOTE — Discharge Instructions (Signed)
Stop your blood pressure medicine Please drink fluids and eat Please talk to you family doctor about shortness of breath. Also talk to him about low sodium.

## 2015-12-27 NOTE — ED Notes (Signed)
Glucose by BMP 105

## 2015-12-27 NOTE — ED Notes (Signed)
Pt and daughter states she understands instructions. Home stable via wc.

## 2015-12-27 NOTE — ED Notes (Signed)
Pt urged to provide urine. Cup at bedside.

## 2015-12-27 NOTE — ED Notes (Signed)
Patient up ambulatory to the bathroom without any difficulty or distress to attempt an urine sample; visitor at bedside

## 2016-07-27 ENCOUNTER — Other Ambulatory Visit: Payer: Self-pay | Admitting: Internal Medicine

## 2016-07-27 DIAGNOSIS — Z1231 Encounter for screening mammogram for malignant neoplasm of breast: Secondary | ICD-10-CM

## 2016-08-16 ENCOUNTER — Ambulatory Visit
Admission: RE | Admit: 2016-08-16 | Discharge: 2016-08-16 | Disposition: A | Discharge status: Home / Self Care | Payer: BLUE CROSS/BLUE SHIELD | Source: Ambulatory Visit | Attending: Internal Medicine | Admitting: Internal Medicine

## 2016-08-16 DIAGNOSIS — Z1231 Encounter for screening mammogram for malignant neoplasm of breast: Secondary | ICD-10-CM

## 2016-08-31 ENCOUNTER — Emergency Department (HOSPITAL_COMMUNITY): Payer: BLUE CROSS/BLUE SHIELD

## 2016-08-31 ENCOUNTER — Emergency Department (HOSPITAL_COMMUNITY)
Admission: EM | Admit: 2016-08-31 | Discharge: 2016-08-31 | Disposition: A | Discharge status: Home / Self Care | Payer: BLUE CROSS/BLUE SHIELD | Attending: Emergency Medicine | Admitting: Emergency Medicine

## 2016-08-31 DIAGNOSIS — E860 Dehydration: Secondary | ICD-10-CM | POA: Diagnosis not present

## 2016-08-31 DIAGNOSIS — I10 Essential (primary) hypertension: Secondary | ICD-10-CM | POA: Diagnosis not present

## 2016-08-31 DIAGNOSIS — Z79899 Other long term (current) drug therapy: Secondary | ICD-10-CM | POA: Diagnosis not present

## 2016-08-31 DIAGNOSIS — F17210 Nicotine dependence, cigarettes, uncomplicated: Secondary | ICD-10-CM | POA: Diagnosis not present

## 2016-08-31 DIAGNOSIS — R55 Syncope and collapse: Secondary | ICD-10-CM

## 2016-08-31 LAB — BASIC METABOLIC PANEL
Anion gap: 15 (ref 5–15)
BUN: 11 mg/dL (ref 6–20)
CO2: 19 mmol/L — ABNORMAL LOW (ref 22–32)
Calcium: 8.6 mg/dL — ABNORMAL LOW (ref 8.9–10.3)
Chloride: 104 mmol/L (ref 101–111)
Creatinine, Ser: 0.91 mg/dL (ref 0.44–1.00)
GFR calc Af Amer: >60 mL/min (ref >60)
GFR calc non Af Amer: >60 mL/min (ref >60)
Glucose, Bld: 86 mg/dL (ref 65–99)
Potassium: 4.1 mmol/L (ref 3.5–5.1)
Sodium: 138 mmol/L (ref 135–145)

## 2016-08-31 LAB — CBC
HCT: 35.1 % — ABNORMAL LOW (ref 36.0–46.0)
Hemoglobin: 12.5 g/dL (ref 12.0–15.0)
MCH: 31.3 pg (ref 26.0–34.0)
MCHC: 35.6 g/dL (ref 30.0–36.0)
MCV: 88 fL (ref 78.0–100.0)
Platelets: 188 10*3/uL (ref 150–400)
RBC: 3.99 MIL/uL (ref 3.87–5.11)
RDW: 14.1 % (ref 11.5–15.5)
WBC: 8.1 10*3/uL (ref 4.0–10.5)

## 2016-08-31 LAB — URINALYSIS, ROUTINE W REFLEX MICROSCOPIC
Bilirubin Urine: NEGATIVE
Glucose, UA: NEGATIVE mg/dL
Hgb urine dipstick: NEGATIVE
Ketones, ur: NEGATIVE mg/dL
Leukocytes, UA: NEGATIVE
Nitrite: NEGATIVE
Protein, ur: NEGATIVE mg/dL
Specific Gravity, Urine: 1.025 (ref 1.005–1.030)
pH: 5.5 (ref 5.0–8.0)

## 2016-08-31 LAB — CBG MONITORING, ED: Glucose-Capillary: 84 mg/dL (ref 65–99)

## 2016-08-31 MED ORDER — SODIUM CHLORIDE 0.9 % IV BOLUS (SEPSIS)
1000.0000 mL | Freq: Once | INTRAVENOUS | Status: AC
  Administered 2016-08-31: 1000 mL via INTRAVENOUS

## 2016-08-31 NOTE — ED Provider Notes (Signed)
[facility] DEPT Provider Note   CSN: [id number] Arrival date & time: 08/31/16  1108     History   Chief Complaint Chief Complaint  Patient presents with  . Loss of Consciousness    HPI Tiffany Frye is a 61 y.o. female.  HPI   Was here to visit son-in-law who was having surgery and sitting outside the hospital when she began to feel lightheaded.  Daughter looked over at her and she was unresponsive initially, looked like body was shaking, slumped over with torso jerking.  She was then awake and confused, and when she stood up to get into wheelchair she had episode of syncope. Was out for a few seconds.  Was initially looking around, not herself, when she came into ED room feels back to normal and better.  No chest pain or shortness of breath.  No nausea or vomiting. No diarrhea, no black or bloody stools. No numbness/weakness/change in vision/facial droop.  No trauma or falls.  No tongue biting/incontinence.  No hx of etoh withdrawal, does not drink every day.  No drug use.  Had a few peanuts today  Husband died a few months ago, and now does not have much of an appetite.  Over the last 2 days has not had much to eat, has not had a meal since Wed night, no meals yesterday.  No hx of seizures.  Has had hx of syncope with fear when younger, this felt similar.   Past Medical History:  Diagnosis Date  . Alcohol abuse   . Depression   . High cholesterol   . Hypertension     There are no active problems to display for this patient.   Past Surgical History:  Procedure Laterality Date  . COLONOSCOPY WITH PROPOFOL N/A 09/20/2015   Procedure: COLONOSCOPY WITH PROPOFOL;  Surgeon: Garlan Fair, MD;  Location: [facility];  Service: Endoscopy;  Laterality: N/A;  . TUBAL LIGATION      OB History    No data available       Home Medications    Prior to Admission medications   Medication Sig Start Date End Date Taking? Authorizing Provider  pantoprazole (PROTONIX) 40 MG tablet  Take 40 mg by mouth daily. 07/18/16  Yes [provider]  simvastatin (ZOCOR) 20 MG tablet Take 20 mg by mouth every evening.   Yes [provider]  losartan (COZAAR) 50 MG tablet Take 50 mg by mouth daily. 08/27/16   [provider]    Family History No family history on file.  Social History Social History  Substance Use Topics  . Smoking status: Current Every Day Smoker    Packs/day: 3.00    Years: 0.00    Types: Cigarettes  . Smokeless tobacco: Never Used  . Alcohol use 100.8 oz/week    168 Cans of beer per week     Comment: decrease to 2-3 beers per week     Allergies   Penicillins   Review of Systems Review of Systems  Constitutional: Positive for appetite change (for months) and fatigue. Negative for fever.  HENT: Negative for sore throat.   Eyes: Negative for visual disturbance.  Respiratory: Negative for cough and shortness of breath.   Cardiovascular: Negative for chest pain.  Gastrointestinal: Negative for abdominal pain, blood in stool, diarrhea, nausea and vomiting.  Genitourinary: Negative for difficulty urinating.  Musculoskeletal: Negative for back pain and neck pain.  Skin: Negative for rash.  Neurological: Positive for syncope and light-headedness. Negative for facial  asymmetry, weakness, numbness and headaches.     Physical Exam Updated Vital Signs BP (!) 170/88 (BP Location: Left Arm)   Pulse 97   Temp 99 F (37.2 C) (Oral)   Resp 16   SpO2 100%   Physical Exam  Constitutional: She is oriented to person, place, and time. She appears well-developed and well-nourished. No distress.  HENT:  Head: Normocephalic and atraumatic.  Eyes: Conjunctivae and EOM are normal.  Neck: Normal range of motion.  Cardiovascular: Normal rate, regular rhythm, normal heart sounds and intact distal pulses.  Exam reveals no gallop and no friction rub.   No murmur heard. Pulmonary/Chest: Effort normal and breath sounds normal. No  respiratory distress. She has no wheezes. She has no rales.  Abdominal: Soft. She exhibits no distension. There is no tenderness. There is no guarding.  Musculoskeletal: She exhibits no edema or tenderness.  Neurological: She is alert and oriented to person, place, and time. She has normal strength. No cranial nerve deficit or sensory deficit. Coordination normal. GCS eye subscore is 4. GCS verbal subscore is 5. GCS motor subscore is 6.  Skin: Skin is warm and dry. No rash noted. She is not diaphoretic. No erythema.  Nursing note and vitals reviewed.    ED Treatments / Results  Labs (all labs ordered are listed, but only abnormal results are displayed) Labs Reviewed  BASIC METABOLIC PANEL - Abnormal; Notable for the following:       Result Value   CO2 19 (*)    Calcium 8.6 (*)    All other components within normal limits  CBC - Abnormal; Notable for the following:    HCT 35.1 (*)    All other components within normal limits  URINALYSIS, ROUTINE W REFLEX MICROSCOPIC - Abnormal; Notable for the following:    Bacteria, UA RARE (*)    Squamous Epithelial / LPF 0-5 (*)    All other components within normal limits  CBG MONITORING, ED  CBG MONITORING, ED    EKG  EKG Interpretation  Date/Time:  Friday August 31 2016 11:12:59 EDT Ventricular Rate:  79 PR Interval:    QRS Duration: 115 QT Interval:  426 QTC Calculation: 489 R Axis:   78 Text Interpretation:  Sinus rhythm Nonspecific intraventricular conduction delay No significant change since last tracing Confirmed by Gareth Morgan [phone]) on 08/31/2016 11:30:52 AM       Radiology Ct Head Wo Contrast  Result Date: 08/31/2016 CLINICAL DATA:  Seizure. EXAM: CT HEAD WITHOUT CONTRAST TECHNIQUE: Contiguous axial images were obtained from the base of the skull through the vertex without intravenous contrast. COMPARISON:  CT scan January 18, 2015. FINDINGS: Brain: Mild chronic ischemic white matter disease is noted. No mass effect or  midline shift is noted. Ventricular size is within normal limits. There is no evidence of mass lesion, hemorrhage or acute infarction. Vascular: No hyperdense vessel or unexpected calcification. Skull: Normal. Negative for fracture or focal lesion. Sinuses/Orbits: No acute finding. Other: None. IMPRESSION: Mild chronic ischemic white matter disease. No acute intracranial abnormality seen. Electronically Signed   By: Marijo Conception, M.D.   On: 08/31/2016 12:59    Procedures Procedures (including critical care time)  Medications Ordered in ED Medications  sodium chloride 0.9 % bolus 1,000 mL (0 mLs Intravenous Stopped 08/31/16 1457)     Initial Impression / Assessment and Plan / ED Course  I have reviewed the triage vital signs and the nursing notes.  Pertinent labs & imaging results that were  available during my care of the patient were reviewed by me and considered in my medical decision making (see chart for details).     61yo female with history of htn, hlpd, depression, presents with concern for syncope.  EKG evaluated by me and shows sinus rhythm with no sign of prolonged QTc, no brugada, no sign of HOCM, no ST abnormalities. No chest pain or dyspnea, doubt ACS/PE. No significant anemia or electrolyte abnormalities. UA without infection.  Pt reports she has not had much of anything to eat since the day before yesterday and suspect dehydration most likely etiology. Given IV fluids, tolerating po in ED.     Given question of shaking with syncope, ordered head CT which showed no acute findings. Recommend follow up with PCP and Neurology for evaluation for possible seizure, although suspect dehydration/vasovagal syncope most likely. Patient discharged in stable condition with understanding of reasons to return.    Final Clinical Impressions(s) / ED Diagnoses   Final diagnoses:  Syncope, unspecified syncope type  Dehydration    New Prescriptions Discharge Medication List as of 08/31/2016   2:45 PM       Gareth Morgan, MD 09/01/16 [phone]

## 2016-08-31 NOTE — ED Notes (Signed)
Pt made aware of need for urine specimen 

## 2016-08-31 NOTE — ED Notes (Signed)
CBG 84. Patient not yet registered. RN Sheryn Bison. is aware

## 2016-08-31 NOTE — ED Triage Notes (Signed)
Pt was at the hospital entrance with her family when her daughter noticed patient was slumped over and having uncontrolled body movements. Pt was confused when family was able to arouse her. Pt daughter states trying to get pt into a wheelchair and pt collapsed. Pt has no memory of the events. Pt AO x4 during assessment.  No complaints of pain at this time. Pt has a hx of hypertension.

## 2016-08-31 NOTE — ED Notes (Signed)
Pt reports not eating since 8/22 in the evening.

## 2016-08-31 NOTE — ED Notes (Signed)
Bed: WA23 Expected date:  Expected time:  Means of arrival:  Comments: [facility] center 

## 2017-03-07 ENCOUNTER — Other Ambulatory Visit: Payer: Self-pay | Admitting: Internal Medicine

## 2017-03-07 ENCOUNTER — Ambulatory Visit
Admission: RE | Admit: 2017-03-07 | Discharge: 2017-03-07 | Disposition: A | Discharge status: Home / Self Care | Payer: BLUE CROSS/BLUE SHIELD | Source: Ambulatory Visit | Attending: Internal Medicine | Admitting: Internal Medicine

## 2017-03-07 DIAGNOSIS — R059 Cough, unspecified: Secondary | ICD-10-CM

## 2017-03-07 DIAGNOSIS — R05 Cough: Secondary | ICD-10-CM

## 2017-03-08 ENCOUNTER — Other Ambulatory Visit: Payer: Self-pay | Admitting: Internal Medicine

## 2017-03-08 DIAGNOSIS — R7989 Other specified abnormal findings of blood chemistry: Secondary | ICD-10-CM

## 2017-03-08 DIAGNOSIS — R945 Abnormal results of liver function studies: Principal | ICD-10-CM

## 2017-03-15 ENCOUNTER — Ambulatory Visit
Admission: RE | Admit: 2017-03-15 | Discharge: 2017-03-15 | Disposition: A | Discharge status: Home / Self Care | Payer: BLUE CROSS/BLUE SHIELD | Source: Ambulatory Visit | Attending: Internal Medicine | Admitting: Internal Medicine

## 2017-03-15 ENCOUNTER — Other Ambulatory Visit (HOSPITAL_COMMUNITY): Payer: Self-pay | Admitting: Internal Medicine

## 2017-03-15 DIAGNOSIS — R945 Abnormal results of liver function studies: Principal | ICD-10-CM

## 2017-03-15 DIAGNOSIS — R7989 Other specified abnormal findings of blood chemistry: Secondary | ICD-10-CM

## 2017-03-21 ENCOUNTER — Other Ambulatory Visit (HOSPITAL_COMMUNITY): Payer: Self-pay | Admitting: Internal Medicine

## 2017-03-21 DIAGNOSIS — K838 Other specified diseases of biliary tract: Secondary | ICD-10-CM

## 2017-03-30 ENCOUNTER — Other Ambulatory Visit (HOSPITAL_COMMUNITY): Payer: Self-pay | Admitting: Internal Medicine

## 2017-03-30 ENCOUNTER — Ambulatory Visit (HOSPITAL_COMMUNITY)
Admission: RE | Admit: 2017-03-30 | Discharge: 2017-03-30 | Disposition: A | Discharge status: Home / Self Care | Payer: BLUE CROSS/BLUE SHIELD | Source: Ambulatory Visit | Attending: Internal Medicine | Admitting: Internal Medicine

## 2017-03-30 DIAGNOSIS — K838 Other specified diseases of biliary tract: Secondary | ICD-10-CM

## 2017-03-30 DIAGNOSIS — K760 Fatty (change of) liver, not elsewhere classified: Secondary | ICD-10-CM | POA: Insufficient documentation

## 2017-03-30 MED ORDER — GADOBENATE DIMEGLUMINE 529 MG/ML IV SOLN
15.0000 mL | Freq: Once | INTRAVENOUS | Status: AC | PRN
  Administered 2017-03-30: 14 mL via INTRAVENOUS

## 2017-04-01 LAB — POCT I-STAT CREATININE: Creatinine, Ser: 0.6 mg/dL (ref 0.44–1.00)

## 2017-04-02 ENCOUNTER — Other Ambulatory Visit: Payer: Self-pay | Admitting: Internal Medicine

## 2017-04-02 DIAGNOSIS — K838 Other specified diseases of biliary tract: Secondary | ICD-10-CM

## 2017-07-18 ENCOUNTER — Other Ambulatory Visit (HOSPITAL_COMMUNITY): Payer: Self-pay | Admitting: Respiratory Therapy

## 2017-07-18 DIAGNOSIS — J449 Chronic obstructive pulmonary disease, unspecified: Secondary | ICD-10-CM

## 2017-07-26 ENCOUNTER — Inpatient Hospital Stay (HOSPITAL_COMMUNITY): Admission: RE | Admit: 2017-07-26 | Payer: BLUE CROSS/BLUE SHIELD | Source: Ambulatory Visit

## 2017-11-13 ENCOUNTER — Other Ambulatory Visit: Payer: Self-pay | Admitting: Internal Medicine

## 2017-11-13 DIAGNOSIS — Z1231 Encounter for screening mammogram for malignant neoplasm of breast: Secondary | ICD-10-CM

## 2017-11-15 ENCOUNTER — Ambulatory Visit (HOSPITAL_COMMUNITY)
Admission: RE | Admit: 2017-11-15 | Discharge: 2017-11-15 | Disposition: A | Discharge status: Home / Self Care | Payer: BLUE CROSS/BLUE SHIELD | Source: Ambulatory Visit | Attending: Internal Medicine | Admitting: Internal Medicine

## 2017-11-15 ENCOUNTER — Other Ambulatory Visit (HOSPITAL_COMMUNITY): Payer: Self-pay

## 2017-11-15 DIAGNOSIS — J449 Chronic obstructive pulmonary disease, unspecified: Secondary | ICD-10-CM | POA: Diagnosis present

## 2017-11-15 LAB — PULMONARY FUNCTION TEST
DL/VA % pred: 49 %
DL/VA: 2.3 ml/min/mmHg/L
DLCO unc % pred: 20 %
DLCO unc: 4.7 ml/min/mmHg
FEF 25-75 Post: 0.75 L/sec
FEF 25-75 Pre: 0.63 L/sec
FEF2575-%Change-Post: 19 %
FEF2575-%Pred-Post: 40 %
FEF2575-%Pred-Pre: 34 %
FEV1-%Change-Post: 20 %
FEV1-%Pred-Post: 81 %
FEV1-%Pred-Pre: 67 %
FEV1-Post: 1.55 L
FEV1-Pre: 1.29 L
FEV1FVC-%Change-Post: 23 %
FEV1FVC-%Pred-Pre: 61 %
FEV6-%Change-Post: 2 %
FEV6-%Pred-Post: 111 %
FEV6-%Pred-Pre: 109 %
FEV6-Post: 2.62 L
FEV6-Pre: 2.56 L
FEV6FVC-%Change-Post: 4 %
FEV6FVC-%Pred-Post: 103 %
FEV6FVC-%Pred-Pre: 99 %
FVC-%Change-Post: -2 %
FVC-%Pred-Post: 107 %
FVC-%Pred-Pre: 109 %
FVC-Post: 2.62 L
FVC-Pre: 2.68 L
Post FEV1/FVC ratio: 59 %
Post FEV6/FVC ratio: 100 %
Pre FEV1/FVC ratio: 48 %
Pre FEV6/FVC Ratio: 95 %

## 2017-11-15 MED ORDER — ALBUTEROL SULFATE (2.5 MG/3ML) 0.083% IN NEBU
2.5000 mg | INHALATION_SOLUTION | Freq: Once | RESPIRATORY_TRACT | Status: AC
  Administered 2017-11-15: 2.5 mg via RESPIRATORY_TRACT

## 2017-12-25 ENCOUNTER — Ambulatory Visit
Admission: RE | Admit: 2017-12-25 | Discharge: 2017-12-25 | Disposition: A | Discharge status: Home / Self Care | Payer: BLUE CROSS/BLUE SHIELD | Source: Ambulatory Visit | Attending: Internal Medicine | Admitting: Internal Medicine

## 2017-12-25 DIAGNOSIS — Z1231 Encounter for screening mammogram for malignant neoplasm of breast: Secondary | ICD-10-CM

## 2018-01-09 ENCOUNTER — Other Ambulatory Visit: Payer: Self-pay | Admitting: Internal Medicine

## 2018-01-09 DIAGNOSIS — R928 Other abnormal and inconclusive findings on diagnostic imaging of breast: Secondary | ICD-10-CM

## 2018-01-09 DIAGNOSIS — N63 Unspecified lump in unspecified breast: Secondary | ICD-10-CM

## 2018-01-10 ENCOUNTER — Other Ambulatory Visit: Payer: Self-pay

## 2018-01-10 ENCOUNTER — Other Ambulatory Visit: Payer: Self-pay | Admitting: Internal Medicine

## 2018-01-10 DIAGNOSIS — R928 Other abnormal and inconclusive findings on diagnostic imaging of breast: Secondary | ICD-10-CM

## 2018-01-21 ENCOUNTER — Ambulatory Visit
Admission: RE | Admit: 2018-01-21 | Discharge: 2018-01-21 | Disposition: A | Discharge status: Home / Self Care | Payer: BLUE CROSS/BLUE SHIELD | Source: Ambulatory Visit | Attending: Internal Medicine | Admitting: Internal Medicine

## 2018-01-21 DIAGNOSIS — R928 Other abnormal and inconclusive findings on diagnostic imaging of breast: Secondary | ICD-10-CM

## 2018-02-28 ENCOUNTER — Emergency Department (HOSPITAL_COMMUNITY): Payer: BLUE CROSS/BLUE SHIELD

## 2018-02-28 ENCOUNTER — Inpatient Hospital Stay (HOSPITAL_COMMUNITY)
Admission: EM | Admit: 2018-02-28 | Discharge: 2018-03-13 | DRG: 071 | Disposition: A | Discharge status: Skilled Nursing Facility | Payer: BLUE CROSS/BLUE SHIELD | Attending: Internal Medicine | Admitting: Internal Medicine

## 2018-02-28 DIAGNOSIS — R410 Disorientation, unspecified: Secondary | ICD-10-CM

## 2018-02-28 DIAGNOSIS — D638 Anemia in other chronic diseases classified elsewhere: Secondary | ICD-10-CM | POA: Diagnosis present

## 2018-02-28 DIAGNOSIS — E871 Hypo-osmolality and hyponatremia: Secondary | ICD-10-CM | POA: Diagnosis present

## 2018-02-28 DIAGNOSIS — F1010 Alcohol abuse, uncomplicated: Secondary | ICD-10-CM | POA: Diagnosis present

## 2018-02-28 DIAGNOSIS — E876 Hypokalemia: Secondary | ICD-10-CM

## 2018-02-28 DIAGNOSIS — E519 Thiamine deficiency, unspecified: Secondary | ICD-10-CM

## 2018-02-28 DIAGNOSIS — E785 Hyperlipidemia, unspecified: Secondary | ICD-10-CM | POA: Diagnosis present

## 2018-02-28 DIAGNOSIS — Z880 Allergy status to penicillin: Secondary | ICD-10-CM

## 2018-02-28 DIAGNOSIS — K7030 Alcoholic cirrhosis of liver without ascites: Secondary | ICD-10-CM | POA: Diagnosis present

## 2018-02-28 DIAGNOSIS — F329 Major depressive disorder, single episode, unspecified: Secondary | ICD-10-CM | POA: Diagnosis present

## 2018-02-28 DIAGNOSIS — G9341 Metabolic encephalopathy: Principal | ICD-10-CM | POA: Diagnosis present

## 2018-02-28 DIAGNOSIS — Z716 Tobacco abuse counseling: Secondary | ICD-10-CM

## 2018-02-28 DIAGNOSIS — Z6820 Body mass index (BMI) 20.0-20.9, adult: Secondary | ICD-10-CM

## 2018-02-28 DIAGNOSIS — R945 Abnormal results of liver function studies: Secondary | ICD-10-CM

## 2018-02-28 DIAGNOSIS — Z7141 Alcohol abuse counseling and surveillance of alcoholic: Secondary | ICD-10-CM

## 2018-02-28 DIAGNOSIS — R7989 Other specified abnormal findings of blood chemistry: Secondary | ICD-10-CM

## 2018-02-28 DIAGNOSIS — D696 Thrombocytopenia, unspecified: Secondary | ICD-10-CM

## 2018-02-28 DIAGNOSIS — I10 Essential (primary) hypertension: Secondary | ICD-10-CM | POA: Diagnosis present

## 2018-02-28 DIAGNOSIS — E875 Hyperkalemia: Secondary | ICD-10-CM | POA: Diagnosis present

## 2018-02-28 DIAGNOSIS — F17210 Nicotine dependence, cigarettes, uncomplicated: Secondary | ICD-10-CM | POA: Diagnosis present

## 2018-02-28 DIAGNOSIS — E440 Moderate protein-calorie malnutrition: Secondary | ICD-10-CM

## 2018-02-28 DIAGNOSIS — F1020 Alcohol dependence, uncomplicated: Secondary | ICD-10-CM | POA: Diagnosis present

## 2018-02-28 DIAGNOSIS — E7800 Pure hypercholesterolemia, unspecified: Secondary | ICD-10-CM | POA: Diagnosis present

## 2018-02-28 DIAGNOSIS — Z79899 Other long term (current) drug therapy: Secondary | ICD-10-CM

## 2018-02-28 DIAGNOSIS — Z803 Family history of malignant neoplasm of breast: Secondary | ICD-10-CM

## 2018-02-28 DIAGNOSIS — K219 Gastro-esophageal reflux disease without esophagitis: Secondary | ICD-10-CM | POA: Diagnosis present

## 2018-02-28 DIAGNOSIS — E538 Deficiency of other specified B group vitamins: Secondary | ICD-10-CM | POA: Diagnosis present

## 2018-02-28 DIAGNOSIS — F1290 Cannabis use, unspecified, uncomplicated: Secondary | ICD-10-CM | POA: Diagnosis present

## 2018-02-28 DIAGNOSIS — E860 Dehydration: Secondary | ICD-10-CM | POA: Diagnosis present

## 2018-02-28 LAB — COMPREHENSIVE METABOLIC PANEL
ALT: 72 U/L — ABNORMAL HIGH (ref 0–44)
AST: 138 U/L — ABNORMAL HIGH (ref 15–41)
Albumin: 3.3 g/dL — ABNORMAL LOW (ref 3.5–5.0)
Alkaline Phosphatase: 93 U/L (ref 38–126)
Anion gap: 13 (ref 5–15)
BUN: <5 mg/dL — ABNORMAL LOW (ref 8–23)
CO2: 17 mmol/L — ABNORMAL LOW (ref 22–32)
Calcium: 9.2 mg/dL (ref 8.9–10.3)
Chloride: 97 mmol/L — ABNORMAL LOW (ref 98–111)
Creatinine, Ser: 0.73 mg/dL (ref 0.44–1.00)
GFR calc Af Amer: >60 mL/min (ref >60)
GFR calc non Af Amer: >60 mL/min (ref >60)
Glucose, Bld: 95 mg/dL (ref 70–99)
Potassium: 5.6 mmol/L — ABNORMAL HIGH (ref 3.5–5.1)
Sodium: 127 mmol/L — ABNORMAL LOW (ref 135–145)
Total Bilirubin: 1.7 mg/dL — ABNORMAL HIGH (ref 0.3–1.2)
Total Protein: 6.9 g/dL (ref 6.5–8.1)

## 2018-02-28 LAB — CBC WITH DIFFERENTIAL/PLATELET
Abs Immature Granulocytes: 0.05 10*3/uL (ref 0.00–0.07)
Basophils Absolute: 0 10*3/uL (ref 0.0–0.1)
Basophils Relative: 0 %
Eosinophils Absolute: 0 10*3/uL (ref 0.0–0.5)
Eosinophils Relative: 0 %
HCT: 30.3 % — ABNORMAL LOW (ref 36.0–46.0)
Hemoglobin: 10.8 g/dL — ABNORMAL LOW (ref 12.0–15.0)
Immature Granulocytes: 1 %
Lymphocytes Relative: 31 %
Lymphs Abs: 2.6 10*3/uL (ref 0.7–4.0)
MCH: 31.5 pg (ref 26.0–34.0)
MCHC: 35.6 g/dL (ref 30.0–36.0)
MCV: 88.3 fL (ref 80.0–100.0)
Monocytes Absolute: 0.6 10*3/uL (ref 0.1–1.0)
Monocytes Relative: 7 %
Neutro Abs: 5.1 10*3/uL (ref 1.7–7.7)
Neutrophils Relative %: 61 %
Platelets: 141 10*3/uL — ABNORMAL LOW (ref 150–400)
RBC: 3.43 MIL/uL — ABNORMAL LOW (ref 3.87–5.11)
RDW: 12.8 % (ref 11.5–15.5)
WBC: 8.4 10*3/uL (ref 4.0–10.5)
nRBC: 0.4 % — ABNORMAL HIGH (ref 0.0–0.2)

## 2018-02-28 LAB — TROPONIN I: Troponin I: <0.03 ng/mL (ref <0.03)

## 2018-02-28 LAB — CBG MONITORING, ED: Glucose-Capillary: 87 mg/dL (ref 70–99)

## 2018-02-28 LAB — AMMONIA: Ammonia: 26 umol/L (ref 9–35)

## 2018-02-28 MED ORDER — SODIUM CHLORIDE 0.9 % IV SOLN
250.0000 mL | INTRAVENOUS | Status: DC
  Administered 2018-02-28: 250 mL via INTRAVENOUS

## 2018-02-28 MED ORDER — LACTATED RINGERS IV BOLUS
1000.0000 mL | Freq: Once | INTRAVENOUS | Status: AC
  Administered 2018-02-28: 1000 mL via INTRAVENOUS

## 2018-02-28 NOTE — ED Provider Notes (Signed)
[facility] EMERGENCY DEPARTMENT Provider Note   CSN: [id number] Arrival date & time: 02/28/18  2125  LEVEL 5 CAVEAT - ALTERED MENTAL STATUS   History   Chief Complaint Chief Complaint  Patient presents with  . Fatigue    HPI Tiffany Frye is a 63 y.o. female.     HPI  62 year old female presents with altered mental status.  The patient tells me she is here for shortness of breath that started 1 hour ago.  Family tells me that she is been confused for at least a week or so, mostly responding oddly to questions or repeating questions.  However it is significantly worse since they have been talking to her tonight.  I tried to get her up to walk to take her to the hospital but her legs seem to be so weak she could not walk.  EMS brought her to the ER.  History is otherwise fairly limited except that she is been coughing for about a week with no known fevers.  Past Medical History:  Diagnosis Date  . Alcohol abuse   . Depression   . High cholesterol   . Hypertension     Patient Active Problem List   Diagnosis Date Noted  . Hyponatremia 03/01/2018  . Hyperkalemia 03/01/2018  . Acute metabolic encephalopathy 42/59/5638  . Alcohol abuse   . High cholesterol   . Hypertension     Past Surgical History:  Procedure Laterality Date  . COLONOSCOPY WITH PROPOFOL N/A 09/20/2015   Procedure: COLONOSCOPY WITH PROPOFOL;  Surgeon: Garlan Fair, MD;  Location: [facility];  Service: Endoscopy;  Laterality: N/A;  . TUBAL LIGATION       OB History   No obstetric history on file.      Home Medications    Prior to Admission medications   Medication Sig Start Date End Date Taking? Authorizing Provider  losartan (COZAAR) 50 MG tablet Take 50 mg by mouth daily. 08/27/16  Yes [provider]  pantoprazole (PROTONIX) 40 MG tablet Take 40 mg by mouth daily. 07/18/16  Yes [provider]  simvastatin (ZOCOR) 20 MG tablet Take 20 mg by mouth every  evening.   Yes [provider]    Family History Family History  Problem Relation Age of Onset  . Breast cancer Sister 76    Social History Social History   Tobacco Use  . Smoking status: Current Every Day Smoker    Packs/day: 3.00    Years: 0.00    Pack years: 0.00    Types: Cigarettes  . Smokeless tobacco: Never Used  Substance Use Topics  . Alcohol use: Yes    Alcohol/week: 168.0 standard drinks    Types: 168 Cans of beer per week    Comment: decrease to 2-3 beers per week  . Drug use: No     Allergies   Penicillins   Review of Systems Review of Systems  Unable to perform ROS: Mental status change     Physical Exam Updated Vital Signs BP 139/74   Pulse 84   Temp 97.7 F (36.5 C) (Oral)   Resp 20   SpO2 100%   Physical Exam Vitals signs and nursing note reviewed.  Constitutional:      Appearance: She is well-developed.  HENT:     Head: Normocephalic and atraumatic.     Right Ear: External ear normal.     Left Ear: External ear normal.     Nose: Nose normal.  Mouth/Throat:     Mouth: Mucous membranes are dry.  Eyes:     General:        Right eye: No discharge.        Left eye: No discharge.     Extraocular Movements: Extraocular movements intact.     Pupils: Pupils are equal, round, and reactive to light.  Cardiovascular:     Rate and Rhythm: Normal rate and regular rhythm.     Heart sounds: Normal heart sounds.  Pulmonary:     Effort: Pulmonary effort is normal.     Breath sounds: Normal breath sounds.  Abdominal:     Palpations: Abdomen is soft.     Tenderness: There is no abdominal tenderness.  Skin:    General: Skin is warm and dry.  Neurological:     Mental Status: She is alert.     Comments: Awake, alert, oriented to place (hospital but wrong one), self, but not date/time.  CN 3-12 grossly intact. 5/5 strength in all 4 extremities but generally weak. Grossly normal sensation. Normal finger to nose.   Psychiatric:         Mood and Affect: Mood is not anxious.      ED Treatments / Results  Labs (all labs ordered are listed, but only abnormal results are displayed) Labs Reviewed  COMPREHENSIVE METABOLIC PANEL - Abnormal; Notable for the following components:      Result Value   Sodium 127 (*)    Potassium 5.6 (*)    Chloride 97 (*)    CO2 17 (*)    BUN <5 (*)    Albumin 3.3 (*)    AST 138 (*)    ALT 72 (*)    Total Bilirubin 1.7 (*)    All other components within normal limits  CBC WITH DIFFERENTIAL/PLATELET - Abnormal; Notable for the following components:   RBC 3.43 (*)    Hemoglobin 10.8 (*)    HCT 30.3 (*)    Platelets 141 (*)    nRBC 0.4 (*)    All other components within normal limits  AMMONIA  TROPONIN I  URINALYSIS, COMPLETE (UACMP) WITH MICROSCOPIC  RAPID URINE DRUG SCREEN, HOSP PERFORMED  POTASSIUM  OSMOLALITY, URINE  OSMOLALITY  SODIUM, URINE, RANDOM  VITAMIN B12  FOLATE  TSH  VITAMIN B1  HEPATITIS PANEL, ACUTE  HIV ANTIBODY (ROUTINE TESTING W REFLEX)  BASIC METABOLIC PANEL  BASIC METABOLIC PANEL  BASIC METABOLIC PANEL  BASIC METABOLIC PANEL  CBG MONITORING, ED  I-STAT ARTERIAL BLOOD GAS, ED    EKG EKG Interpretation  Date/Time:  Friday February 28 2018 22:11:35 EST Ventricular Rate:  83 PR Interval:    QRS Duration: 79 QT Interval:  401 QTC Calculation: 472 R Axis:   59 Text Interpretation:  Sinus rhythm Borderline repolarization abnormality ST depressions a little more pronounced compared to 02-Oct-2016 Confirmed by Sherwood Gambler [phone]) on 02/28/2018 10:20:36 PM   Radiology Dg Chest 2 View  Result Date: 02/28/2018 CLINICAL DATA:  Shortness of breath EXAM: CHEST - 2 VIEW COMPARISON:  03/07/2017 FINDINGS: The heart size and mediastinal contours are within normal limits. Both lungs are clear. The visualized skeletal structures are unremarkable. Aortic atherosclerosis. IMPRESSION: No active cardiopulmonary disease. Electronically Signed   By: Donavan Foil M.D.    On: 02/28/2018 22:39   Ct Head Wo Contrast  Result Date: 02/28/2018 CLINICAL DATA:  Altered LOC EXAM: CT HEAD WITHOUT CONTRAST TECHNIQUE: Contiguous axial images were obtained from the base of the skull through  the vertex without intravenous contrast. COMPARISON:  CT brain 08/31/2016 FINDINGS: Brain: No acute territorial infarction, hemorrhage or intracranial mass. Moderate atrophy. Moderate to marked small vessel ischemic changes of the white matter. Stable enlarged ventricles. Vascular: No hyperdense vessels. Vertebral and carotid vascular calcification Skull: Normal. Negative for fracture or focal lesion. Sinuses/Orbits: Chronic fracture deformity of the left medial orbital wall. Other: None IMPRESSION: 1. No CT evidence for acute intracranial abnormality. 2. Atrophy and small vessel ischemic changes of the white matter Electronically Signed   By: Donavan Foil M.D.   On: 02/28/2018 22:05    Procedures Procedures (including critical care time)  Medications Ordered in ED Medications  0.9 %  sodium chloride infusion (250 mLs Intravenous New Bag/Given 02/28/18 2346)  simvastatin (ZOCOR) tablet 20 mg (has no administration in time range)  pantoprazole (PROTONIX) EC tablet 40 mg (has no administration in time range)  0.9 %  sodium chloride infusion (has no administration in time range)  nicotine (NICODERM CQ - dosed in mg/24 hours) patch 21 mg (has no administration in time range)  LORazepam (ATIVAN) tablet 1 mg (has no administration in time range)    Or  LORazepam (ATIVAN) injection 1 mg (has no administration in time range)  thiamine (VITAMIN B-1) tablet 100 mg (has no administration in time range)    Or  thiamine (B-1) injection 100 mg (has no administration in time range)  folic acid (FOLVITE) tablet 1 mg (has no administration in time range)  multivitamin with minerals tablet 1 tablet (has no administration in time range)  LORazepam (ATIVAN) injection 0-4 mg (has no administration in  time range)    Followed by  LORazepam (ATIVAN) injection 0-4 mg (has no administration in time range)  enoxaparin (LOVENOX) injection 40 mg (has no administration in time range)  ondansetron (ZOFRAN) tablet 4 mg (has no administration in time range)    Or  ondansetron (ZOFRAN) injection 4 mg (has no administration in time range)  hydrALAZINE (APRESOLINE) injection 5 mg (has no administration in time range)  lactated ringers bolus 1,000 mL (1,000 mLs Intravenous New Bag/Given 02/28/18 2345)     Initial Impression / Assessment and Plan / ED Course  I have reviewed the triage vital signs and the nursing notes.  Pertinent labs & imaging results that were available during my care of the patient were reviewed by me and considered in my medical decision making (see chart for details).        Currently there is no clear cause for her altered mental status though this seems to be progressively worsening over several days.  She is afebrile and her vitals are stable.  She has had a cough but is not hypoxic.  Mildly tachypneic but no distress.  At this point there is no clear cause with a negative CT and labs though urine is pending and she already had urinary incontinence on the stretcher so urine will have to wait.  She will still need admission as she lives at home alone with this weakness and altered mental state.  Dr. Blaine Hamper to admit.  Final Clinical Impressions(s) / ED Diagnoses   Final diagnoses:  Delirium    ED Discharge Orders    None       Sherwood Gambler, MD 03/01/18 [phone]

## 2018-02-28 NOTE — ED Triage Notes (Signed)
Pt arrived via [facility] from home where pt lives with family. Family reports pt being more confused this week. Pt also reported pt is wearing depends which is atypical for her. Pt has hx of HTN per EMS.  EMS v/s 170/100, 142/84, cbg 97, hr96, T98.7.  Pt is alert and oriented x4 at this time.

## 2018-03-01 ENCOUNTER — Encounter (HOSPITAL_COMMUNITY): Payer: Self-pay | Admitting: Internal Medicine

## 2018-03-01 ENCOUNTER — Other Ambulatory Visit: Payer: Self-pay

## 2018-03-01 DIAGNOSIS — E7800 Pure hypercholesterolemia, unspecified: Secondary | ICD-10-CM

## 2018-03-01 DIAGNOSIS — F1010 Alcohol abuse, uncomplicated: Secondary | ICD-10-CM | POA: Diagnosis present

## 2018-03-01 DIAGNOSIS — E875 Hyperkalemia: Secondary | ICD-10-CM | POA: Diagnosis not present

## 2018-03-01 DIAGNOSIS — I10 Essential (primary) hypertension: Secondary | ICD-10-CM | POA: Diagnosis present

## 2018-03-01 DIAGNOSIS — G9341 Metabolic encephalopathy: Secondary | ICD-10-CM | POA: Diagnosis not present

## 2018-03-01 DIAGNOSIS — E871 Hypo-osmolality and hyponatremia: Secondary | ICD-10-CM

## 2018-03-01 LAB — RAPID URINE DRUG SCREEN, HOSP PERFORMED
Amphetamines: NOT DETECTED
Barbiturates: NOT DETECTED
Benzodiazepines: NOT DETECTED
Cocaine: NOT DETECTED
Opiates: NOT DETECTED
Tetrahydrocannabinol: POSITIVE — AB

## 2018-03-01 LAB — URINALYSIS, COMPLETE (UACMP) WITH MICROSCOPIC
Bilirubin Urine: NEGATIVE
Glucose, UA: NEGATIVE mg/dL
Hgb urine dipstick: NEGATIVE
Ketones, ur: 20 mg/dL — AB
Leukocytes,Ua: NEGATIVE
Nitrite: NEGATIVE
Protein, ur: NEGATIVE mg/dL
Specific Gravity, Urine: 1.01 (ref 1.005–1.030)
pH: 5 (ref 5.0–8.0)

## 2018-03-01 LAB — BASIC METABOLIC PANEL
Anion gap: 12 (ref 5–15)
Anion gap: 9 (ref 5–15)
BUN: <5 mg/dL — ABNORMAL LOW (ref 8–23)
BUN: <5 mg/dL — ABNORMAL LOW (ref 8–23)
CO2: 19 mmol/L — ABNORMAL LOW (ref 22–32)
CO2: 17 mmol/L — ABNORMAL LOW (ref 22–32)
Calcium: 8.8 mg/dL — ABNORMAL LOW (ref 8.9–10.3)
Calcium: 8.6 mg/dL — ABNORMAL LOW (ref 8.9–10.3)
Chloride: 99 mmol/L (ref 98–111)
Chloride: 104 mmol/L (ref 98–111)
Creatinine, Ser: 0.66 mg/dL (ref 0.44–1.00)
Creatinine, Ser: 0.65 mg/dL (ref 0.44–1.00)
GFR calc Af Amer: >60 mL/min (ref >60)
GFR calc Af Amer: >60 mL/min (ref >60)
GFR calc non Af Amer: >60 mL/min (ref >60)
GFR calc non Af Amer: >60 mL/min (ref >60)
Glucose, Bld: 79 mg/dL (ref 70–99)
Glucose, Bld: 81 mg/dL (ref 70–99)
Potassium: 3.9 mmol/L (ref 3.5–5.1)
Potassium: 4 mmol/L (ref 3.5–5.1)
Sodium: 130 mmol/L — ABNORMAL LOW (ref 135–145)
Sodium: 130 mmol/L — ABNORMAL LOW (ref 135–145)

## 2018-03-01 LAB — TSH: TSH: 1.94 u[IU]/mL (ref 0.350–4.500)

## 2018-03-01 LAB — OSMOLALITY: Osmolality: 267 mOsm/kg — ABNORMAL LOW (ref 275–295)

## 2018-03-01 LAB — ETHANOL: Alcohol, Ethyl (B): <10 mg/dL (ref <10)

## 2018-03-01 LAB — SODIUM, URINE, RANDOM: Sodium, Ur: 21 mmol/L

## 2018-03-01 LAB — VITAMIN B12: Vitamin B-12: 1088 pg/mL — ABNORMAL HIGH (ref 180–914)

## 2018-03-01 LAB — OSMOLALITY, URINE: Osmolality, Ur: 295 mOsm/kg — ABNORMAL LOW (ref 300–900)

## 2018-03-01 LAB — HIV ANTIBODY (ROUTINE TESTING W REFLEX): HIV Screen 4th Generation wRfx: NONREACTIVE

## 2018-03-01 LAB — FOLATE: Folate: 3.6 ng/mL — ABNORMAL LOW (ref >5.9)

## 2018-03-01 LAB — GLUCOSE, CAPILLARY: Glucose-Capillary: 70 mg/dL (ref 70–99)

## 2018-03-01 MED ORDER — ONDANSETRON HCL 4 MG/2ML IJ SOLN: 4.0000 mg | Freq: Four times a day (QID) | INTRAMUSCULAR | Status: DC | PRN

## 2018-03-01 MED ORDER — HYDRALAZINE HCL 20 MG/ML IJ SOLN: 5.0000 mg | INTRAMUSCULAR | Status: DC | PRN

## 2018-03-01 MED ORDER — ENOXAPARIN SODIUM 40 MG/0.4ML ~~LOC~~ SOLN
40.0000 mg | SUBCUTANEOUS | Status: DC
  Administered 2018-03-01: 40 mg via SUBCUTANEOUS
  Filled 2018-03-01 (×2): qty 0.4

## 2018-03-01 MED ORDER — FOLIC ACID 1 MG PO TABS
1.0000 mg | ORAL_TABLET | Freq: Every day | ORAL | Status: DC
  Administered 2018-03-01 – 2018-03-13 (×13): 1 mg via ORAL
  Filled 2018-03-01 (×13): qty 1

## 2018-03-01 MED ORDER — ONDANSETRON HCL 4 MG PO TABS: 4.0000 mg | ORAL_TABLET | Freq: Four times a day (QID) | ORAL | Status: DC | PRN

## 2018-03-01 MED ORDER — THIAMINE HCL 100 MG/ML IJ SOLN
100.0000 mg | Freq: Every day | INTRAMUSCULAR | Status: DC
  Administered 2018-03-01: 100 mg via INTRAVENOUS
  Filled 2018-03-01 (×2): qty 2

## 2018-03-01 MED ORDER — LORAZEPAM 1 MG PO TABS: 1.0000 mg | ORAL_TABLET | Freq: Four times a day (QID) | ORAL | Status: DC | PRN

## 2018-03-01 MED ORDER — ADULT MULTIVITAMIN W/MINERALS CH
1.0000 | ORAL_TABLET | Freq: Every day | ORAL | Status: DC
  Administered 2018-03-01 – 2018-03-13 (×13): 1 via ORAL
  Filled 2018-03-01 (×13): qty 1

## 2018-03-01 MED ORDER — LORAZEPAM 2 MG/ML IJ SOLN
1.0000 mg | Freq: Four times a day (QID) | INTRAMUSCULAR | Status: DC | PRN
  Administered 2018-03-01 – 2018-03-02 (×2): 1 mg via INTRAVENOUS
  Filled 2018-03-01 (×3): qty 1

## 2018-03-01 MED ORDER — NICOTINE 21 MG/24HR TD PT24
21.0000 mg | MEDICATED_PATCH | Freq: Every day | TRANSDERMAL | Status: DC
  Administered 2018-03-01 – 2018-03-13 (×12): 21 mg via TRANSDERMAL
  Filled 2018-03-01 (×13): qty 1

## 2018-03-01 MED ORDER — SODIUM CHLORIDE 0.9 % IV SOLN
INTRAVENOUS | Status: DC
  Administered 2018-03-01 (×3): via INTRAVENOUS

## 2018-03-01 MED ORDER — SIMVASTATIN 20 MG PO TABS
20.0000 mg | ORAL_TABLET | Freq: Every evening | ORAL | Status: DC
  Administered 2018-03-01: 20 mg via ORAL
  Filled 2018-03-01: qty 1

## 2018-03-01 MED ORDER — LORAZEPAM 2 MG/ML IJ SOLN
0.0000 mg | Freq: Two times a day (BID) | INTRAMUSCULAR | Status: DC
  Filled 2018-03-01: qty 1

## 2018-03-01 MED ORDER — PANTOPRAZOLE SODIUM 40 MG PO TBEC
40.0000 mg | DELAYED_RELEASE_TABLET | Freq: Every day | ORAL | Status: DC
  Administered 2018-03-01 – 2018-03-13 (×13): 40 mg via ORAL
  Filled 2018-03-01 (×13): qty 1

## 2018-03-01 MED ORDER — LORAZEPAM 2 MG/ML IJ SOLN
0.0000 mg | Freq: Four times a day (QID) | INTRAMUSCULAR | Status: DC
  Administered 2018-03-01: 2 mg via INTRAVENOUS

## 2018-03-01 MED ORDER — VITAMIN B-1 100 MG PO TABS
100.0000 mg | ORAL_TABLET | Freq: Every day | ORAL | Status: DC
  Administered 2018-03-02 – 2018-03-07 (×6): 100 mg via ORAL
  Filled 2018-03-01 (×6): qty 1

## 2018-03-01 NOTE — Progress Notes (Signed)
Patient admitted after midnight, please see H&P.   Here with worsening confusion.  Labs show a low folate level.  Patient uses heavy alcohol daily so suspect this is contributing.  Will need continued monitoring/labs to r/o acute issues before discharge. 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  Eulogio Bear DO

## 2018-03-01 NOTE — H&P (Signed)
History and Physical    Tiffany Frye XNA:[id number] DOB: Sep 18, 1955 DOA: 02/28/2018  Referring MD/NP/PA:   PCP: System, Pcp Not In   Patient coming from:  The patient is coming from home.  At baseline, pt is independent for most of ADL.        Chief Complaint: Confusion  HPI: Tiffany Frye is a 63 y.o. female with medical history significant of Hypertension, hyperlipidemia, GERD, depression, alcohol and tobacco abuse, who presents with confusion and altered mental status.  Per patient's daughter, patient's mental status has been declining in the past 3 to 4 months, but much worse in the past 7 days.  Patient becomes more confused, asking questions repeatedly, talking out of her head.  She moves all extremities normally, no unilateral weakness, facial droop, slurred speech.  Does not seem to have vision loss or hearing loss.  She does not seem to have chest pain.  She has mild cough and shortness of breath due to smoking, which is close to baseline.  No fever or chills.  Patient has loose stool bowel movement, but no active nausea vomiting or abdominal pain.  Not sure if patient any symptoms of UTI.  ED Course: pt was found to have negative troponin, WBC 8.4, sodium 127, potassium 5.6 (has blood hemolysis), creatinine and BUN normal, ammonia level 26.  Temperature normal, no tachycardia, oxygen saturation 100% on room air, negative chest x-ray, negative CT head for acute intracranial abnormalities.  Patient is placed on telemetry bed for observation.  Review of Systems: Cannot be reviewed accurately due to altered mental status.  Allergy:  Allergies  Allergen Reactions  . Penicillins Itching    "Skin itched", denies breathing problems. Has patient had a PCN reaction causing immediate rash, facial/tongue/throat swelling, SOB or lightheadedness with hypotension: Yes Has patient had a PCN reaction causing severe rash involving mucus membranes or skin necrosis: No Has patient had a PCN reaction that  required hospitalization: No Has patient had a PCN reaction occurring within the last 10 years: No If all of the above answers are "NO", then may proceed with Cephalosporin use.     Past Medical History:  Diagnosis Date  . Alcohol abuse   . Depression   . High cholesterol   . Hypertension     Past Surgical History:  Procedure Laterality Date  . COLONOSCOPY WITH PROPOFOL N/A 09/20/2015   Procedure: COLONOSCOPY WITH PROPOFOL;  Surgeon: Garlan Fair, MD;  Location: [facility];  Service: Endoscopy;  Laterality: N/A;  . TUBAL LIGATION      Social History:  reports that she has been smoking cigarettes. She has been smoking about 3.00 packs per day for the past 0.00 years. She has never used smokeless tobacco. She reports current alcohol use of about 168.0 standard drinks of alcohol per week. She reports that she does not use drugs.  Family History:  Family History  Problem Relation Age of Onset  . Breast cancer Sister 7     Prior to Admission medications   Medication Sig Start Date End Date Taking? Authorizing Provider  losartan (COZAAR) 50 MG tablet Take 50 mg by mouth daily. 08/27/16  Yes [provider]  pantoprazole (PROTONIX) 40 MG tablet Take 40 mg by mouth daily. 07/18/16  Yes [provider]  simvastatin (ZOCOR) 20 MG tablet Take 20 mg by mouth every evening.   Yes [provider]    Physical Exam: Vitals:   03/01/18 0130 03/01/18 0137 03/01/18 0200 03/01/18 0300  BP: Marland Kitchen)  144/87 (!) 144/87 (!) 141/80 (!) 162/84  Pulse: 84 89 84 82  Resp: (!) 22  15 (!) 23  Temp:      TempSrc:      SpO2: 100%  100% 100%   General: Not in acute distress HEENT:       Eyes: PERRL, EOMI, no scleral icterus.       ENT: No discharge from the ears and nose, no pharynx injection, no tonsillar enlargement.        Neck: No JVD, no bruit, no mass felt. Heme: No neck lymph node enlargement. Cardiac: S1/S2, RRR, No murmurs, No gallops or rubs. Respiratory: No  rales, wheezing, rhonchi or rubs. GI: Soft, nondistended, nontender, no organomegaly, BS present. GU: No hematuria Ext: No pitting leg edema bilaterally. 2+DP/PT pulse bilaterally. Musculoskeletal: No joint deformities, No joint redness or warmth, no limitation of ROM in spin. Skin: No rashes.  Neuro: Confused, orientated to place and person, but not to time, cranial nerves II-XII grossly intact, moves all extremities. Psych: Patient is not psychotic, no suicidal or hemocidal ideation.  Labs on Admission: I have personally reviewed following labs and imaging studies  CBC: Recent Labs  Lab 02/28/18 2208  WBC 8.4  NEUTROABS 5.1  HGB 10.8*  HCT 30.3*  MCV 88.3  PLT 638*   Basic Metabolic Panel: Recent Labs  Lab 02/28/18 2208  NA 127*  K 5.6*  CL 97*  CO2 17*  GLUCOSE 95  BUN <5*  CREATININE 0.73  CALCIUM 9.2   GFR: CrCl cannot be calculated (Unknown ideal weight.). Liver Function Tests: Recent Labs  Lab 02/28/18 2208  AST 138*  ALT 72*  ALKPHOS 93  BILITOT 1.7*  PROT 6.9  ALBUMIN 3.3*   No results for input(s): LIPASE, AMYLASE in the last 168 hours. Recent Labs  Lab 02/28/18 2208  AMMONIA 26   Coagulation Profile: No results for input(s): INR, PROTIME in the last 168 hours. Cardiac Enzymes: Recent Labs  Lab 02/28/18 2208  TROPONINI <0.03   BNP (last 3 results) No results for input(s): PROBNP in the last 8760 hours. HbA1C: No results for input(s): HGBA1C in the last 72 hours. CBG: Recent Labs  Lab 02/28/18 2221  GLUCAP 87   Lipid Profile: No results for input(s): CHOL, HDL, LDLCALC, TRIG, CHOLHDL, LDLDIRECT in the last 72 hours. Thyroid Function Tests: No results for input(s): TSH, T4TOTAL, FREET4, T3FREE, THYROIDAB in the last 72 hours. Anemia Panel: No results for input(s): VITAMINB12, FOLATE, FERRITIN, TIBC, IRON, RETICCTPCT in the last 72 hours. Urine analysis:    Component Value Date/Time   COLORURINE YELLOW 08/31/2016 [place] 08/31/2016 1308   LABSPEC 1.025 08/31/2016 1308   PHURINE 5.5 08/31/2016 1308   GLUCOSEU NEGATIVE 08/31/2016 1308   [place] 08/31/2016 1308   [place] 08/31/2016 1308   [place] 08/31/2016 1308   PROTEINUR NEGATIVE 08/31/2016 1308   NITRITE NEGATIVE 08/31/2016 1308   LEUKOCYTESUR NEGATIVE 08/31/2016 1308   Sepsis Labs: @LABRCNTIP (procalcitonin:4,lacticidven:4) )No results found for this or any previous visit (from the past 240 hour(s)).   Radiological Exams on Admission: Dg Chest 2 View  Result Date: 02/28/2018 CLINICAL DATA:  Shortness of breath EXAM: CHEST - 2 VIEW COMPARISON:  03/07/2017 FINDINGS: The heart size and mediastinal contours are within normal limits. Both lungs are clear. The visualized skeletal structures are unremarkable. Aortic atherosclerosis. IMPRESSION: No active cardiopulmonary disease. Electronically Signed   By: Donavan Foil M.D.   On: 02/28/2018 22:39  Ct Head Wo Contrast  Result Date: 02/28/2018 CLINICAL DATA:  Altered LOC EXAM: CT HEAD WITHOUT CONTRAST TECHNIQUE: Contiguous axial images were obtained from the base of the skull through the vertex without intravenous contrast. COMPARISON:  CT brain 08/31/2016 FINDINGS: Brain: No acute territorial infarction, hemorrhage or intracranial mass. Moderate atrophy. Moderate to marked small vessel ischemic changes of the white matter. Stable enlarged ventricles. Vascular: No hyperdense vessels. Vertebral and carotid vascular calcification Skull: Normal. Negative for fracture or focal lesion. Sinuses/Orbits: Chronic fracture deformity of the left medial orbital wall. Other: None IMPRESSION: 1. No CT evidence for acute intracranial abnormality. 2. Atrophy and small vessel ischemic changes of the white matter Electronically Signed   By: Donavan Foil M.D.   On: 02/28/2018 22:05     EKG: Independently reviewed.  Sinus rhythm, QTC 472, low voltage, T wave inversion in precordial  leads.  Assessment/Plan Principal Problem:   Acute metabolic encephalopathy Active Problems:   Alcohol abuse   High cholesterol   Hypertension   Hyponatremia   Hyperkalemia   Acute metabolic encephalopathy: Etiology is not clear.  No focal neurologic findings on physical examination.  CT head is negative for acute intracranial abnormalities.  Differential diagnosis include early stage of dementia, delirium, electrolytes disturbance.  Will need to rule out UTI, pending urinalysis now.  -Placed on telemetry bed for observation -Frequent neuro check -Check swallowing screen -correct electrolytes disturbance of hyponatremia - in/ot cath for collecting urine sample to get urinalysis - check vitamin Y59, vitamin B1, folic acid, TSH  Tobacco abuse and Alcohol abuse: -Nicotine patch -CIWA protocol  High cholesterol: -zocor  HTN:  -hold Cozaar due to hyponatremia -IV hydralazine prn  GERD: -Protonix  Hyponatremia: Na 127. Likely due to poor oral intake and dehydration, potomania - Will check urine sodium, urine osmolality, serum osmolality. - check TSH - IVF: 1 ringer solution was given in ED, will continue with IV normal saline at 75 mL/h - f/u by BMP q6h  Hyperkalemia: K=5.6, but has blood hemolysis -Repeat bmap   DVT ppx:  SQ Lovenox Code Status: Full code Family Communication:  Yes, patient's daughter and son-in-law   hyponatremia bed side Disposition Plan:  Anticipate discharge back to previous home environment Consults called:  none Admission status: Obs / tele     Date of Service 03/01/2018    [facility]   If 7PM-7AM, please contact night-coverage [email or web address] Password [facility] 03/01/2018, 3:36 AM

## 2018-03-01 NOTE — Progress Notes (Signed)
ABG was done earlier in the shift and reported to MD. Results would not cross over in [place].  Ph: 7.65, co2 <15, po2 157; were the only results that would report.

## 2018-03-01 NOTE — ED Notes (Signed)
Tiffany Frye [phone] Pt daughter

## 2018-03-02 DIAGNOSIS — G9341 Metabolic encephalopathy: Secondary | ICD-10-CM | POA: Diagnosis not present

## 2018-03-02 DIAGNOSIS — E876 Hypokalemia: Secondary | ICD-10-CM

## 2018-03-02 DIAGNOSIS — E871 Hypo-osmolality and hyponatremia: Secondary | ICD-10-CM | POA: Diagnosis not present

## 2018-03-02 DIAGNOSIS — F1010 Alcohol abuse, uncomplicated: Secondary | ICD-10-CM | POA: Diagnosis not present

## 2018-03-02 LAB — BASIC METABOLIC PANEL
Anion gap: 10 (ref 5–15)
BUN: <5 mg/dL — ABNORMAL LOW (ref 8–23)
CO2: 18 mmol/L — ABNORMAL LOW (ref 22–32)
Calcium: 7.6 mg/dL — ABNORMAL LOW (ref 8.9–10.3)
Chloride: 104 mmol/L (ref 98–111)
Creatinine, Ser: 0.65 mg/dL (ref 0.44–1.00)
GFR calc Af Amer: >60 mL/min (ref >60)
GFR calc non Af Amer: >60 mL/min (ref >60)
Glucose, Bld: 94 mg/dL (ref 70–99)
Potassium: 3.1 mmol/L — ABNORMAL LOW (ref 3.5–5.1)
Sodium: 132 mmol/L — ABNORMAL LOW (ref 135–145)

## 2018-03-02 LAB — CBC
HCT: 20.8 % — ABNORMAL LOW (ref 36.0–46.0)
Hemoglobin: 7.2 g/dL — ABNORMAL LOW (ref 12.0–15.0)
MCH: 31.3 pg (ref 26.0–34.0)
MCHC: 34.6 g/dL (ref 30.0–36.0)
MCV: 90.4 fL (ref 80.0–100.0)
Platelets: 96 10*3/uL — ABNORMAL LOW (ref 150–400)
RBC: 2.3 MIL/uL — ABNORMAL LOW (ref 3.87–5.11)
RDW: 13.1 % (ref 11.5–15.5)
WBC: 8.1 10*3/uL (ref 4.0–10.5)
nRBC: 0.5 % — ABNORMAL HIGH (ref 0.0–0.2)

## 2018-03-02 LAB — RPR: RPR Ser Ql: NONREACTIVE

## 2018-03-02 LAB — OCCULT BLOOD X 1 CARD TO LAB, STOOL: Fecal Occult Bld: NEGATIVE

## 2018-03-02 MED ORDER — POTASSIUM CHLORIDE CRYS ER 20 MEQ PO TBCR
40.0000 meq | EXTENDED_RELEASE_TABLET | Freq: Once | ORAL | Status: AC
  Administered 2018-03-02: 40 meq via ORAL
  Filled 2018-03-02: qty 2

## 2018-03-02 NOTE — Progress Notes (Signed)
Progress Note    Tiffany Frye  XQJ:[id number] DOB: 07-29-1955  DOA: 02/28/2018 PCP: System, Pcp Not In    Brief Narrative:     Medical records reviewed and are as summarized below:  Tiffany Frye is an 63 y.o. female with medical history significant of Hypertension, hyperlipidemia, GERD, depression, alcohol and tobacco abuse, who presents with confusion and altered mental status.  Suspect all issues related to her lack of eating and alcohol consumption.    Assessment/Plan:   Principal Problem:   Acute metabolic encephalopathy Active Problems:   Alcohol abuse   High cholesterol   Hypertension   Hyponatremia   Hyperkalemia   Acute metabolic encephalopathy with low folate acid: -CT head is negative for acute intracranial abnormalities. -suspect related to heavy alcohol consumption and malnutrition -  vitamin B12 elevated-- prob cirrhosis from alcohol abuse -vitamin B1 pending but will supplement - folic acid low so will supplement - TSH normal  Anemia -unclear source -heme negative x 1 -iron pending -? Volume dilution -has seen hematology in past w/o cause found per family  Tobacco abuse and Alcohol abuse: -Nicotine patch -CIWA protocol -social work consult  High cholesterol: -hold zocor due to elevated liver enzymes  HTN:  -IV hydralazine prn  GERD: -Protonix  Hyponatremia:  - Likely due to poor oral intake and dehydration, potomania -trending up  Hyperkalemia-- now hypokalemic -lab error due to hemolysis -replete-- check Mg in AM  Family Communication/Anticipated D/C date and plan/Code Status   DVT prophylaxis: scd Code Status: Full Code.  Family Communication: daughter at bedside (cell phone in chart) Disposition Plan: pending PT eval and improvement   Medical Consultants:    None.     Subjective:   Wants to go home and go back to work -- also would like an icehouse and a cigarette   Objective:    Vitals:   03/01/18 1712  03/01/18 2158 03/02/18 0623 03/02/18 0809  BP: (!) 109/96 130/73 115/71 106/69  Pulse: 78 67 74 78  Resp: 18 16 16 18   Temp: 98.1 F (36.7 C) 97.8 F (36.6 C) 98.2 F (36.8 C) 98.3 F (36.8 C)  TempSrc: Oral Oral Oral Oral  SpO2: 100% 100% 100% 100%    Intake/Output Summary (Last 24 hours) at 03/02/2018 1338 Last data filed at 03/02/2018 1238 Gross per 24 hour  Intake 1405.61 ml  Output 200 ml  Net 1205.61 ml   There were no vitals filed for this visit.  Exam: Getting out of bed-- impulsive More awake and alert today rrr Clear, no wheezing No LE edema  Data Reviewed:   I have personally reviewed following labs and imaging studies:  Labs: Labs show the following:   Basic Metabolic Panel: Recent Labs  Lab 02/28/18 2208 03/01/18 0450 03/01/18 0931 03/02/18 0546  NA 127* 130* 130* 132*  K 5.6* 3.9 4.0 3.1*  CL 97* 99 104 104  CO2 17* 19* 17* 18*  GLUCOSE 95 79 81 94  BUN <5* <5* <5* <5*  CREATININE 0.73 0.66 0.65 0.65  CALCIUM 9.2 8.8* 8.6* 7.6*   GFR CrCl cannot be calculated (Unknown ideal weight.). Liver Function Tests: Recent Labs  Lab 02/28/18 2208  AST 138*  ALT 72*  ALKPHOS 93  BILITOT 1.7*  PROT 6.9  ALBUMIN 3.3*   No results for input(s): LIPASE, AMYLASE in the last 168 hours. Recent Labs  Lab 02/28/18 2208  AMMONIA 26   Coagulation profile No results for input(s): INR, PROTIME in the last  168 hours.  CBC: Recent Labs  Lab 02/28/18 2208 03/02/18 0546  WBC 8.4 8.1  NEUTROABS 5.1  --   HGB 10.8* 7.2*  HCT 30.3* 20.8*  MCV 88.3 90.4  PLT 141* 96*   Cardiac Enzymes: Recent Labs  Lab 02/28/18 2208  TROPONINI <0.03   BNP (last 3 results) No results for input(s): PROBNP in the last 8760 hours. CBG: Recent Labs  Lab 02/28/18 2221 03/01/18 0750  GLUCAP 87 70   D-Dimer: No results for input(s): DDIMER in the last 72 hours. Hgb A1c: No results for input(s): HGBA1C in the last 72 hours. Lipid Profile: No results for  input(s): CHOL, HDL, LDLCALC, TRIG, CHOLHDL, LDLDIRECT in the last 72 hours. Thyroid function studies: Recent Labs    03/01/18 0450  TSH 1.940   Anemia work up: Recent Labs    03/01/18 0450  VITAMINB12 1,088*  FOLATE 3.6*   Sepsis Labs: Recent Labs  Lab 02/28/18 2208 03/02/18 0546  WBC 8.4 8.1    Microbiology No results found for this or any previous visit (from the past 240 hour(s)).  Procedures and diagnostic studies:  Dg Chest 2 View  Result Date: 02/28/2018 CLINICAL DATA:  Shortness of breath EXAM: CHEST - 2 VIEW COMPARISON:  03/07/2017 FINDINGS: The heart size and mediastinal contours are within normal limits. Both lungs are clear. The visualized skeletal structures are unremarkable. Aortic atherosclerosis. IMPRESSION: No active cardiopulmonary disease. Electronically Signed   By: Donavan Foil M.D.   On: 02/28/2018 22:39   Ct Head Wo Contrast  Result Date: 02/28/2018 CLINICAL DATA:  Altered LOC EXAM: CT HEAD WITHOUT CONTRAST TECHNIQUE: Contiguous axial images were obtained from the base of the skull through the vertex without intravenous contrast. COMPARISON:  CT brain 08/31/2016 FINDINGS: Brain: No acute territorial infarction, hemorrhage or intracranial mass. Moderate atrophy. Moderate to marked small vessel ischemic changes of the white matter. Stable enlarged ventricles. Vascular: No hyperdense vessels. Vertebral and carotid vascular calcification Skull: Normal. Negative for fracture or focal lesion. Sinuses/Orbits: Chronic fracture deformity of the left medial orbital wall. Other: None IMPRESSION: 1. No CT evidence for acute intracranial abnormality. 2. Atrophy and small vessel ischemic changes of the white matter Electronically Signed   By: Donavan Foil M.D.   On: 02/28/2018 22:05    Medications:   . enoxaparin (LOVENOX) injection  40 mg Subcutaneous Q24H  . folic acid  1 mg Oral Daily  . LORazepam  0-4 mg Intravenous Q6H   Followed by  . [START ON 03/03/2018]  LORazepam  0-4 mg Intravenous Q12H  . multivitamin with minerals  1 tablet Oral Daily  . nicotine  21 mg Transdermal Daily  . pantoprazole  40 mg Oral Daily  . simvastatin  20 mg Oral QPM  . thiamine  100 mg Oral Daily   Or  . thiamine  100 mg Intravenous Daily   Continuous Infusions: . sodium chloride Stopped (03/01/18 0138)  . sodium chloride 75 mL/hr at 03/01/18 1813     LOS: 0 days   Geradine Girt  [facility]   How to contact the [facility] Attending or Consulting provider [place] or covering provider during after hours [place], for this patient?  1. Check the care team in [facility] and look for a) attending/consulting TR[facility] provider listed and b) the [facility] team listed 2. Log into [email or web address] and use [place]'s universal password to access. If you do not have the password, please contact the hospital operator. 3. Locate the [facility]  provider you are looking for under [facility] and page to a number that you can be directly reached. 4. If you still have difficulty reaching the provider, please page the [facility] (Director on Call) for the Hospitalists listed on amion for assistance.  03/02/2018, 1:38 PM

## 2018-03-02 NOTE — Progress Notes (Signed)
CSW met with patient to discuss ETOH use. CSW noted patient scored a 6 on the SBIRT. CSW noted patient reported drinking 2 32oz beverages a couple times a week and stated she wants to quit. CSW processed patient's report of quitting briefly previously. CSW discussed psycho-education on the physical health impacts of chronic alcohol use and possible withdrawal symptoms. CSW reviewed treatment options with outpatient and residential resources and utilized motivational skills to encourage patient to seek substance use treatment upon hospital discharge. CSW signing off at this time.  Lamonte Richer, LCSW, [place] Worker II [phone]

## 2018-03-03 DIAGNOSIS — E860 Dehydration: Secondary | ICD-10-CM | POA: Diagnosis present

## 2018-03-03 DIAGNOSIS — E785 Hyperlipidemia, unspecified: Secondary | ICD-10-CM | POA: Diagnosis present

## 2018-03-03 DIAGNOSIS — E440 Moderate protein-calorie malnutrition: Secondary | ICD-10-CM

## 2018-03-03 DIAGNOSIS — F329 Major depressive disorder, single episode, unspecified: Secondary | ICD-10-CM | POA: Diagnosis present

## 2018-03-03 DIAGNOSIS — G9341 Metabolic encephalopathy: Secondary | ICD-10-CM | POA: Diagnosis present

## 2018-03-03 DIAGNOSIS — R410 Disorientation, unspecified: Secondary | ICD-10-CM | POA: Diagnosis present

## 2018-03-03 DIAGNOSIS — F1290 Cannabis use, unspecified, uncomplicated: Secondary | ICD-10-CM | POA: Diagnosis present

## 2018-03-03 DIAGNOSIS — E871 Hypo-osmolality and hyponatremia: Secondary | ICD-10-CM | POA: Diagnosis present

## 2018-03-03 DIAGNOSIS — Z716 Tobacco abuse counseling: Secondary | ICD-10-CM | POA: Diagnosis not present

## 2018-03-03 DIAGNOSIS — F1010 Alcohol abuse, uncomplicated: Secondary | ICD-10-CM | POA: Diagnosis not present

## 2018-03-03 DIAGNOSIS — D638 Anemia in other chronic diseases classified elsewhere: Secondary | ICD-10-CM | POA: Diagnosis present

## 2018-03-03 DIAGNOSIS — K7030 Alcoholic cirrhosis of liver without ascites: Secondary | ICD-10-CM | POA: Diagnosis present

## 2018-03-03 DIAGNOSIS — I10 Essential (primary) hypertension: Secondary | ICD-10-CM | POA: Diagnosis present

## 2018-03-03 DIAGNOSIS — E876 Hypokalemia: Secondary | ICD-10-CM | POA: Diagnosis present

## 2018-03-03 DIAGNOSIS — E7800 Pure hypercholesterolemia, unspecified: Secondary | ICD-10-CM | POA: Diagnosis present

## 2018-03-03 DIAGNOSIS — F17210 Nicotine dependence, cigarettes, uncomplicated: Secondary | ICD-10-CM | POA: Diagnosis present

## 2018-03-03 DIAGNOSIS — Z803 Family history of malignant neoplasm of breast: Secondary | ICD-10-CM | POA: Diagnosis not present

## 2018-03-03 DIAGNOSIS — E875 Hyperkalemia: Secondary | ICD-10-CM | POA: Diagnosis not present

## 2018-03-03 DIAGNOSIS — Z6820 Body mass index (BMI) 20.0-20.9, adult: Secondary | ICD-10-CM | POA: Diagnosis not present

## 2018-03-03 DIAGNOSIS — F1020 Alcohol dependence, uncomplicated: Secondary | ICD-10-CM | POA: Diagnosis present

## 2018-03-03 DIAGNOSIS — D696 Thrombocytopenia, unspecified: Secondary | ICD-10-CM | POA: Diagnosis present

## 2018-03-03 DIAGNOSIS — K219 Gastro-esophageal reflux disease without esophagitis: Secondary | ICD-10-CM | POA: Diagnosis present

## 2018-03-03 DIAGNOSIS — Z79899 Other long term (current) drug therapy: Secondary | ICD-10-CM | POA: Diagnosis not present

## 2018-03-03 DIAGNOSIS — Z7141 Alcohol abuse counseling and surveillance of alcoholic: Secondary | ICD-10-CM | POA: Diagnosis not present

## 2018-03-03 DIAGNOSIS — Z880 Allergy status to penicillin: Secondary | ICD-10-CM | POA: Diagnosis not present

## 2018-03-03 DIAGNOSIS — E538 Deficiency of other specified B group vitamins: Secondary | ICD-10-CM | POA: Diagnosis present

## 2018-03-03 LAB — COMPREHENSIVE METABOLIC PANEL
ALT: 46 U/L — ABNORMAL HIGH (ref 0–44)
AST: 72 U/L — ABNORMAL HIGH (ref 15–41)
Albumin: 2.6 g/dL — ABNORMAL LOW (ref 3.5–5.0)
Alkaline Phosphatase: 89 U/L (ref 38–126)
Anion gap: 5 (ref 5–15)
BUN: <5 mg/dL — ABNORMAL LOW (ref 8–23)
CO2: 21 mmol/L — ABNORMAL LOW (ref 22–32)
Calcium: 8.6 mg/dL — ABNORMAL LOW (ref 8.9–10.3)
Chloride: 107 mmol/L (ref 98–111)
Creatinine, Ser: 0.66 mg/dL (ref 0.44–1.00)
GFR calc Af Amer: >60 mL/min (ref >60)
GFR calc non Af Amer: >60 mL/min (ref >60)
Glucose, Bld: 96 mg/dL (ref 70–99)
Potassium: 3.9 mmol/L (ref 3.5–5.1)
Sodium: 133 mmol/L — ABNORMAL LOW (ref 135–145)
Total Bilirubin: 0.9 mg/dL (ref 0.3–1.2)
Total Protein: 5.8 g/dL — ABNORMAL LOW (ref 6.5–8.1)

## 2018-03-03 LAB — FERRITIN: Ferritin: 437 ng/mL — ABNORMAL HIGH (ref 11–307)

## 2018-03-03 LAB — HEPATITIS PANEL, ACUTE
HCV Ab: <0.1 s/co ratio (ref 0.0–0.9)
Hep A IgM: NEGATIVE
Hep B C IgM: NEGATIVE
Hepatitis B Surface Ag: NEGATIVE

## 2018-03-03 LAB — IRON AND TIBC
Iron: 99 ug/dL (ref 28–170)
Saturation Ratios: 44 % — ABNORMAL HIGH (ref 10.4–31.8)
TIBC: 224 ug/dL — ABNORMAL LOW (ref 250–450)
UIBC: 125 ug/dL

## 2018-03-03 LAB — CBC
HCT: 24.5 % — ABNORMAL LOW (ref 36.0–46.0)
Hemoglobin: 8.5 g/dL — ABNORMAL LOW (ref 12.0–15.0)
MCH: 32 pg (ref 26.0–34.0)
MCHC: 34.7 g/dL (ref 30.0–36.0)
MCV: 92.1 fL (ref 80.0–100.0)
Platelets: 124 10*3/uL — ABNORMAL LOW (ref 150–400)
RBC: 2.66 MIL/uL — ABNORMAL LOW (ref 3.87–5.11)
RDW: 13.2 % (ref 11.5–15.5)
WBC: 10.1 10*3/uL (ref 4.0–10.5)
nRBC: 0.4 % — ABNORMAL HIGH (ref 0.0–0.2)

## 2018-03-03 LAB — MAGNESIUM: Magnesium: 1.5 mg/dL — ABNORMAL LOW (ref 1.7–2.4)

## 2018-03-03 MED ORDER — MAGNESIUM SULFATE 2 GM/50ML IV SOLN
2.0000 g | Freq: Once | INTRAVENOUS | Status: AC
  Administered 2018-03-03: 2 g via INTRAVENOUS
  Filled 2018-03-03: qty 50

## 2018-03-03 NOTE — Progress Notes (Signed)
Pt keeps coughing after drinking thin liquids. May need to have a swallow test done. Will let oncoming RN know.   Eleanora Neighbor, RN

## 2018-03-03 NOTE — Evaluation (Signed)
Physical Therapy Evaluation Patient Details Name: Tiffany Frye MRN: [id number] DOB: February 28, 1955 Today's Date: 03/03/2018   History of Present Illness  Pt is a 63 y/o female with a PMH significant for HTN, ETOH abuse. She presents with AMS, and daughter states that mental status has been declining for months and acutely worsened in the past week. AMS felt to be related to heavy alcohol consumption and malnutrition. CT Head and CXR both negative for acute changes.  Clinical Impression  Pt admitted with above diagnosis. Pt currently with functional limitations due to the deficits listed below (see PT Problem List). At the time of PT eval pt was able to perform transfers and ambulation with up to +2 min assist for balance support and safety. Pt restless and impulsive throughout session, but seemed to calm after gait training in hallway. At this time, due to confusion, decreased safety awareness, increased risk for falls, and decreased assist available at home, recommend PT follow-up at the SNF level. Will continue to assess for most appropriate d/c disposition as I anticipate pt will progress quickly as mentation clears. Acutely, pt will benefit from skilled PT to increase their independence and safety with mobility to allow discharge to the venue listed below.       Follow Up Recommendations SNF;Supervision/Assistance - 24 hour    Equipment Recommendations       Recommendations for Other Services       Precautions / Restrictions Precautions Precautions: Fall Restrictions Weight Bearing Restrictions: No      Mobility  Bed Mobility Overal bed mobility: Needs Assistance Bed Mobility: Supine to Sit;Sit to Supine     Supine to sit: Supervision Sit to supine: Supervision   General bed mobility comments: Slow and effortful but pt was able to complete without assistance. HOB elevated.  Transfers Overall transfer level: Needs assistance Equipment used: None Transfers: Sit to/from Stand Sit  to Stand: Supervision         General transfer comment: Light supervision provided for power-up to full stand. Upon return to sitting, pt with uncontrolled descent to EOB.   Ambulation/Gait Ambulation/Gait assistance: Min assist;+2 safety/equipment Gait Distance (Feet): 150 Feet Assistive device: 1 person hand held assist;2 person hand held assist Gait Pattern/deviations: Step-through pattern;Decreased stride length;Trunk flexed Gait velocity: Decreased Gait velocity interpretation: <1.8 ft/sec, indicate of risk for recurrent falls General Gait Details: Pt initially without UE support, and required standing rest breaks every few feet due to unsteadiness. With HHA, pt improved but continued to demonstrate unsteadiness requiring pt to stop and gain balance before initiating ambulation again. At end of gait training, pt has 2 person HHA for balance and rest breaks were due to fatigue.   Stairs            Wheelchair Mobility    Modified Rankin (Stroke Patients Only)       Balance Overall balance assessment: Needs assistance Sitting-balance support: Feet supported;No upper extremity supported Sitting balance-Leahy Scale: Fair     Standing balance support: No upper extremity supported;During functional activity Standing balance-Leahy Scale: Poor Standing balance comment: Required assist for dynamic standing assistance.                              Pertinent Vitals/Pain Pain Assessment: No/denies pain    Home Living Family/patient expects to be discharged to:: Private residence Living Arrangements: Alone Available Help at Discharge: Family;Available PRN/intermittently(Pt states daughter works)   Home Access: Level entry  Home Layout: One level Home Equipment: None Additional Comments: Pt is a poor historian and currently confused. Unsure how accurate home details are.    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 Again, pt is poor historian and unsure how accurate these details are.     Hand Dominance        Extremity/Trunk Assessment   Upper Extremity Assessment Upper Extremity Assessment: Defer to OT evaluation    Lower Extremity Assessment Lower Extremity Assessment: Generalized weakness(Grossly 4/5 in quads, hamstrings, hip flexors)    Cervical / Trunk Assessment Cervical / Trunk Assessment: Other exceptions Cervical / Trunk Exceptions: Forward head posture with rounded shoulders  Communication   Communication: No difficulties  Cognition Arousal/Alertness: Awake/alert Behavior During Therapy: Impulsive;Restless Overall Cognitive Status: Impaired/Different from baseline Area of Impairment: Orientation;Following commands;Safety/judgement;Awareness;Problem solving;Memory                 Orientation Level: Disoriented to;Place;Time;Situation   Memory: Decreased short-term memory Following Commands: Follows one step commands consistently;Follows one step commands with increased time;Follows multi-step commands inconsistently Safety/Judgement: Decreased awareness of safety;Decreased awareness of deficits Awareness: Intellectual Problem Solving: Slow processing;Requires verbal cues        General Comments      Exercises     Assessment/Plan    PT Assessment Patient needs continued PT services  PT Problem List Decreased strength;Decreased activity tolerance;Decreased balance;Decreased mobility;Decreased knowledge of use of DME;Decreased cognition;Decreased safety awareness;Decreased knowledge of precautions       PT Treatment Interventions DME instruction;Gait training;Functional mobility training;Therapeutic activities;Therapeutic exercise;Neuromuscular re-education;Patient/family education    PT Goals (Current goals can be found in the Care  Plan section)  Acute Rehab PT Goals Patient Stated Goal: "Get some coffee" PT Goal Formulation: With patient Time For Goal Achievement: 03/17/18 Potential to Achieve Goals: Good    Frequency Min 3X/week   Barriers to discharge Decreased caregiver support Pt reports she does not have anyone to help her at home except daughter who works during the day.     Co-evaluation               AM-PAC PT "6 Clicks" Mobility  Outcome Measure Help needed turning from your back to your side while in a flat bed without using bedrails?: None Help needed moving from lying on your back to sitting on the side of a flat bed without using bedrails?: None Help needed moving to and from a bed to a chair (including a wheelchair)?: A Little Help needed standing up from a chair using your arms (e.g., wheelchair or bedside chair)?: A Little Help needed to walk in hospital room?: A Little Help needed climbing 3-5 steps with a railing? : A Lot 6 Click Score: 19    End of Session Equipment Utilized During Treatment: Gait belt Activity Tolerance: Patient tolerated treatment well Patient left: in bed;with call bell/phone within reach;with bed alarm set Nurse Communication: Mobility status      Time: 6226-3335 PT Time Calculation (min) (ACUTE ONLY): 20 min   Charges:   PT Evaluation $PT Eval Moderate Complexity: [place], PT, DPT Acute Rehabilitation Services Pager: [phone] Office: [phone]   Thelma Comp 03/03/2018, 10:51 AM

## 2018-03-03 NOTE — Evaluation (Signed)
Clinical/Bedside Frye Evaluation Patient Details  Name: Tiffany Frye MRN: [id number] Date of Birth: 07/31/1955  Today's Date: 03/03/2018 Time: SLP Start Time (ACUTE ONLY): 0940 SLP Stop Time (ACUTE ONLY): 0953 SLP Time Calculation (min) (ACUTE ONLY): 13 min  Past Medical History:  Past Medical History:  Diagnosis Date  . Alcohol abuse   . Depression   . High cholesterol   . Hypertension    Past Surgical History:  Past Surgical History:  Procedure Laterality Date  . COLONOSCOPY WITH PROPOFOL N/A 09/20/2015   Procedure: COLONOSCOPY WITH PROPOFOL;  Surgeon: Garlan Fair, MD;  Location: [facility]COPY;  Service: Endoscopy;  Laterality: N/A;  . TUBAL LIGATION     HPI:  Pt is a 63 y.o. female who presents with AMS felt to be related to heavy alcohol consumption and malnutrition. CT Head and CXR both negative for acute changes. SLP Frye eval was ordered because pt was noted to be coughing after drinking thin liquids. PMH: HTN, HLD, GERD, depression, alcohol and tobacco abuse   Assessment / Plan / Recommendation Clinical Impression  Pt's oropharyngeal Frye appears grossly functional considering that her dentures are not present, needing additional time for oral preparation. She has no overt s/s of aspiration as was previously noted by RN. Suspect that her performance could fluctuate given mentation and reduced safety awareness, therefore will continue current diet but will provide additional, brief f/u acutely to ensure tolerance. SLP Visit Diagnosis: Dysphagia, unspecified (R13.10)    Aspiration Risk  Mild aspiration risk    Diet Recommendation Regular;Thin liquid   Liquid Administration via: Cup;Straw Medication Administration: Whole meds with liquid Supervision: Patient able to self feed;Intermittent supervision to cue for compensatory strategies Compensations: Slow rate;Small sips/bites Postural Changes: Seated upright at 90 degrees;Remain upright for at least 30 minutes  after po intake    Other  Recommendations Oral Care Recommendations: Oral care BID   Follow up Recommendations 24 hour supervision/assistance      Frequency and Duration min 1 x/week  1 week       Prognosis        Frye Study   General HPI: Pt is a 63 y.o. female who presents with AMS felt to be related to heavy alcohol consumption and malnutrition. CT Head and CXR both negative for acute changes. SLP Frye eval was ordered because pt was noted to be coughing after drinking thin liquids. PMH: HTN, HLD, GERD, depression, alcohol and tobacco abuse Type of Study: Bedside Frye Evaluation Previous Frye Assessment: none in chart Diet Prior to this Study: Regular;Thin liquids Temperature Spikes Noted: No Respiratory Status: Room air History of Recent Intubation: No Behavior/Cognition: Alert;Cooperative;Distractible;Requires cueing Oral Cavity Assessment: Within Functional Limits Oral Care Completed by SLP: No Oral Cavity - Dentition: Dentures, not available;Edentulous Vision: Functional for self-feeding Self-Feeding Abilities: Able to feed self Patient Positioning: Upright in bed Baseline Vocal Quality: Normal Volitional Cough: Strong Volitional Frye: Able to elicit    Oral/Motor/Sensory Function Overall Oral Motor/Sensory Function: Within functional limits   Ice Chips Ice chips: Not tested   Thin Liquid Thin Liquid: Within functional limits Presentation: Cup;Self Fed;Straw    Nectar Thick Nectar Thick Liquid: Not tested   Honey Thick Honey Thick Liquid: Not tested   Puree Puree: Within functional limits Presentation: Self Fed;Spoon   Solid     Solid: Within functional limits(given that her dentures are not present) Presentation: Tiffany Frye 03/03/2018,9:59 AM  Nuala Alpha, M.A. [place] Acute Rehabilitation Services  Pager [phone] Office [phone]

## 2018-03-03 NOTE — Progress Notes (Addendum)
Progress Note    Tiffany Frye  PFX:[id number] DOB: [birth year]  DOA: 02/28/2018 PCP: System, Pcp Not In    Brief Narrative:     Medical records reviewed and are as summarized below:  Tiffany Frye is an 63 y.o. female with medical history significant of Hypertension, hyperlipidemia, GERD, depression, alcohol and tobacco abuse, who presents with confusion and altered mental status.  Suspect all issues related to her lack of eating and alcohol consumption.    Assessment/Plan:   Principal Problem:   Acute metabolic encephalopathy Active Problems:   Alcohol abuse   High cholesterol   Hypertension   Hyponatremia   Hyperkalemia   Acute metabolic encephalopathy with low folate acid: -CT head is negative for acute intracranial abnormalities. -suspect related to heavy alcohol consumption and malnutrition -  vitamin B12 elevated-- prob cirrhosis from alcohol abuse -vitamin B1 pending but will supplement - folic acid low so will supplement - TSH normal  Anemia -unclear source -heme negative x 1 -? Volume dilution  Tobacco abuse and Alcohol abuse: -Nicotine patch -CIWA protocol -social work consult  High cholesterol: -hold zocor due to elevated liver enzymes  HTN:  -IV hydralazine prn  GERD: -Protonix  Hyponatremia:  - Likely due to poor oral intake and dehydration, potomania -trending up  Hyperkalemia-- now hypokalemic -lab error due to hemolysis -hypomagnesium- replete  Family Communication/Anticipated D/C date and plan/Code Status   DVT prophylaxis: scd Code Status: Full Code.  Family Communication: spoke with daughter on phone Disposition Plan: pending PT eval and improvement   Medical Consultants:    None.   Subjective:   No complaints today Night nurse documented coughing with swallowing  Objective:    Vitals:   03/02/18 0809 03/02/18 1633 03/03/18 0520 03/03/18 0851  BP: 106/69 104/77 107/73 120/69  Pulse: 78 80 79 77  Resp: 18 19  18 18   Temp: 98.3 F (36.8 C) 97.6 F (36.4 C) 98.1 F (36.7 C) 97.7 F (36.5 C)  TempSrc: Oral Oral  Oral  SpO2: 100% 100% 98% 100%    Intake/Output Summary (Last 24 hours) at 03/03/2018 0934 Last data filed at 03/03/2018 0400 Gross per 24 hour  Intake 120 ml  Output -  Net 120 ml   There were no vitals filed for this visit.  Exam: Awake and oriented to person and place but not time-- 2019-- January rrr No wheezing, no increased work of breathing +BS, soft, NT  Data Reviewed:   I have personally reviewed following labs and imaging studies:  Labs: Labs show the following:   Basic Metabolic Panel: Recent Labs  Lab 02/28/18 2208 03/01/18 0450 03/01/18 0931 03/02/18 0546 03/03/18 0401  NA 127* 130* 130* 132* 133*  K 5.6* 3.9 4.0 3.1* 3.9  CL 97* 99 104 104 107  CO2 17* 19* 17* 18* 21*  GLUCOSE 95 79 81 94 96  BUN <5* <5* <5* <5* <5*  CREATININE 0.73 0.66 0.65 0.65 0.66  CALCIUM 9.2 8.8* 8.6* 7.6* 8.6*  MG  --   --   --   --  1.5*   GFR CrCl cannot be calculated (Unknown ideal weight.). Liver Function Tests: Recent Labs  Lab 02/28/18 2208 03/03/18 0401  AST 138* 72*  ALT 72* 46*  ALKPHOS 93 89  BILITOT 1.7* 0.9  PROT 6.9 5.8*  ALBUMIN 3.3* 2.6*   No results for input(s): LIPASE, AMYLASE in the last 168 hours. Recent Labs  Lab 02/28/18 2208  AMMONIA 26   Coagulation profile No  results for input(s): INR, PROTIME in the last 168 hours.  CBC: Recent Labs  Lab 02/28/18 2208 03/02/18 0546 03/03/18 0401  WBC 8.4 8.1 10.1  NEUTROABS 5.1  --   --   HGB 10.8* 7.2* 8.5*  HCT 30.3* 20.8* 24.5*  MCV 88.3 90.4 92.1  PLT 141* 96* 124*   Cardiac Enzymes: Recent Labs  Lab 02/28/18 2208  TROPONINI <0.03   BNP (last 3 results) No results for input(s): PROBNP in the last 8760 hours. CBG: Recent Labs  Lab 02/28/18 2221 03/01/18 0750  GLUCAP 87 70   D-Dimer: No results for input(s): DDIMER in the last 72 hours. Hgb A1c: No results for  input(s): HGBA1C in the last 72 hours. Lipid Profile: No results for input(s): CHOL, HDL, LDLCALC, TRIG, CHOLHDL, LDLDIRECT in the last 72 hours. Thyroid function studies: Recent Labs    03/01/18 0450  TSH 1.940   Anemia work up: Recent Labs    03/01/18 0450 03/03/18 0401  VITAMINB12 1,088*  --   FOLATE 3.6*  --   FERRITIN  --  437*  TIBC  --  224*  IRON  --  99   Sepsis Labs: Recent Labs  Lab 02/28/18 2208 03/02/18 0546 03/03/18 0401  WBC 8.4 8.1 10.1    Microbiology No results found for this or any previous visit (from the past 240 hour(s)).  Procedures and diagnostic studies:  No results found.  Medications:   . folic acid  1 mg Oral Daily  . LORazepam  0-4 mg Intravenous Q12H  . multivitamin with minerals  1 tablet Oral Daily  . nicotine  21 mg Transdermal Daily  . pantoprazole  40 mg Oral Daily  . thiamine  100 mg Oral Daily   Or  . thiamine  100 mg Intravenous Daily   Continuous Infusions: . sodium chloride Stopped (03/01/18 0138)  . magnesium sulfate 1 - 4 g bolus IVPB       LOS: 0 days   Geradine Girt  [facility]   How to contact the [facility] Attending or Consulting provider [place] or covering provider during after hours [place], for this patient?  1. Check the care team in [facility] and look for a) attending/consulting TRH provider listed and b) the [facility] team listed 2. Log into [email or web address] and use [place]'s universal password to access. If you do not have the password, please contact the hospital operator. 3. Locate the [facility] provider you are looking for under [facility] and page to a number that you can be directly reached. 4. If you still have difficulty reaching the provider, please page the [facility] (Director on Call) for the Hospitalists listed on amion for assistance.  03/03/2018, 9:34 AM

## 2018-03-03 NOTE — Progress Notes (Signed)
Initial Nutrition Assessment  DOCUMENTATION CODES:   Non-severe (moderate) malnutrition in context of chronic illness  INTERVENTION:   Magic Cup BID (each provides 290 kcal, 9 g protein) Encourage PO intake   NUTRITION DIAGNOSIS:   Moderate Malnutrition related to chronic illness as evidenced by energy intake < 75% for > or equal to 3 months, mild fat depletion, mild muscle depletion.   GOAL:   Patient will meet greater than or equal to 90% of their needs   MONITOR:   PO intake, Supplement acceptance, Labs, Weight trends  REASON FOR ASSESSMENT:   Consult Poor PO  ASSESSMENT:   63 yo female, admitted with acute metabolic encephalopathy. Presents with confusion. PMH significant for alcohol abuse, high cholesterol, HTN.  Labs: sodium 133, BUN <5, AST and ALT elevated, tProtein 5.8, vitamin B-12 elevated, folate low Meds: folvite, MVI with minerals, Protonix EC, thiamine, magnesium sulfate  Pt awake and resting in bed at time of visit. Reports so-so appetite x 1 year. UBW 120-125 lbs and reports 20 lb wt loss x several months - unable to verify in chart. Denies nausea, vomiting, diarrhea, or constipation. Encouraged pt to include protein-rich foods with all meals and snacks, and to eat those foods first if not feeling hungry. Pt amenable to trying Magic Cup BID, declined other protein supplements.  NUTRITION - FOCUSED PHYSICAL EXAM:   Most Recent Value  Orbital Region  Mild depletion  Upper Arm Region  No depletion  Thoracic and Lumbar Region  No depletion  Buccal Region  No depletion  Temple Region  Mild depletion  Clavicle and Acromion Bone Region  Mild depletion  Scapular Bone Region  Unable to assess  Dorsal Hand  Mild depletion  Patellar Region  Unable to assess  Anterior Thigh Region  Mild depletion  Posterior Calf Region  Unable to assess  Edema (RD Assessment)  None  Hair  Reviewed  Eyes  Reviewed  Mouth  Reviewed  Skin  Reviewed  Nails  Reviewed      Diet Order:  No known food allergies 2/22: NPO, Regular Diet Order            Diet regular Room service appropriate? Yes; Fluid consistency: Thin  Diet effective now             Varied intake, average 35% x 5 meals recorded  EDUCATION NEEDS:  No education needs have been identified at this time  Skin:  Skin Assessment: Skin Integrity Issues: Skin Integrity Issues:: Other (Comment) Other: MASD: labia, groin, sacrum, perineum, buttocks, anus, coccyx   Last BM:  2/23  Height:   Ht Readings from Last 1 Encounters:  12/27/15 5\' 3"  (1.6 m)    Weight:  Wt Readings from Last 10 Encounters:  12/27/15 64.4 kg  10/05/15 68.9 kg  09/20/15 68.9 kg  Limited wt hx available Per Care Everywhere, pt weighed 57.6 kg on 01/21/18  Ideal Body Weight:  52.3 kg  BMI:  There is no height or weight on file to calculate BMI.  22.5 = normal, based on 1/14 wt  Estimated Nutritional Needs: calculated based on 1/14 wt (~58 kg)  Kcal:  1450-1740 (25-30 kcal/kg)  Protein:  70-87 gm (1.2-1.5 g/kg)  Fluid:  1 mL/kcal or per MD  Althea Grimmer, MS, RDN, LDN Pager: [phone] Available Mondays and Fridays, 9am-2pm

## 2018-03-04 ENCOUNTER — Inpatient Hospital Stay (HOSPITAL_COMMUNITY): Payer: BLUE CROSS/BLUE SHIELD

## 2018-03-04 DIAGNOSIS — E440 Moderate protein-calorie malnutrition: Secondary | ICD-10-CM

## 2018-03-04 LAB — COMPREHENSIVE METABOLIC PANEL
ALT: 52 U/L — ABNORMAL HIGH (ref 0–44)
AST: 91 U/L — ABNORMAL HIGH (ref 15–41)
Albumin: 2.6 g/dL — ABNORMAL LOW (ref 3.5–5.0)
Alkaline Phosphatase: 101 U/L (ref 38–126)
Anion gap: 8 (ref 5–15)
BUN: <5 mg/dL — ABNORMAL LOW (ref 8–23)
CO2: 20 mmol/L — ABNORMAL LOW (ref 22–32)
Calcium: 8.6 mg/dL — ABNORMAL LOW (ref 8.9–10.3)
Chloride: 103 mmol/L (ref 98–111)
Creatinine, Ser: 0.67 mg/dL (ref 0.44–1.00)
GFR calc Af Amer: >60 mL/min (ref >60)
GFR calc non Af Amer: >60 mL/min (ref >60)
Glucose, Bld: 101 mg/dL — ABNORMAL HIGH (ref 70–99)
Potassium: 3.8 mmol/L (ref 3.5–5.1)
Sodium: 131 mmol/L — ABNORMAL LOW (ref 135–145)
Total Bilirubin: 0.4 mg/dL (ref 0.3–1.2)
Total Protein: 5.6 g/dL — ABNORMAL LOW (ref 6.5–8.1)

## 2018-03-04 LAB — CBC
HCT: 23.7 % — ABNORMAL LOW (ref 36.0–46.0)
Hemoglobin: 8.2 g/dL — ABNORMAL LOW (ref 12.0–15.0)
MCH: 31.4 pg (ref 26.0–34.0)
MCHC: 34.6 g/dL (ref 30.0–36.0)
MCV: 90.8 fL (ref 80.0–100.0)
Platelets: 131 10*3/uL — ABNORMAL LOW (ref 150–400)
RBC: 2.61 MIL/uL — ABNORMAL LOW (ref 3.87–5.11)
RDW: 13.1 % (ref 11.5–15.5)
WBC: 9.8 10*3/uL (ref 4.0–10.5)
nRBC: 0.4 % — ABNORMAL HIGH (ref 0.0–0.2)

## 2018-03-04 LAB — MAGNESIUM: Magnesium: 1.7 mg/dL (ref 1.7–2.4)

## 2018-03-04 NOTE — Progress Notes (Signed)
  Speech Language Pathology Treatment: Dysphagia  Patient Details Name: Toba Claudio MRN: [id number] DOB: 1955/09/11 Today's Date: 03/04/2018 Time: 6948-5462 SLP Time Calculation (min) (ACUTE ONLY): 10 min  Assessment / Plan / Recommendation Clinical Impression  Pt has no overt dysphagia or signs of aspiration again today after being repositioned with assistance from SLP. She denies any difficulty with swallowing or mastication, even with dentures still not available. Recommend that she continue regular solids and thin liquids with use of aspiration precautions. Intermittent supervision and/or set-up assistance for meals is appropriate given her cognitive deficits. SLP to sign off acutely.   HPI HPI: Pt is a 63 y.o. female who presents with AMS felt to be related to heavy alcohol consumption and malnutrition. CT Head and CXR both negative for acute changes. SLP swallow eval was ordered because pt was noted to be coughing after drinking thin liquids. PMH: HTN, HLD, GERD, depression, alcohol and tobacco abuse      SLP Plan  All goals met       Recommendations  Diet recommendations: Regular;Thin liquid Liquids provided via: Cup;Straw Medication Administration: Whole meds with liquid Supervision: Patient able to self feed;Intermittent supervision to cue for compensatory strategies Compensations: Slow rate;Small sips/bites Postural Changes and/or Swallow Maneuvers: Seated upright 90 degrees                Oral Care Recommendations: Oral care BID Follow up Recommendations: 24 hour supervision/assistance SLP Visit Diagnosis: Dysphagia, unspecified (R13.10) Plan: All goals met       GO                Venita Sheffield Kaytlynne Neace 03/04/2018, 11:39 AM  Pollyann Glen, M.A. [employer] Acute Environmental education officer [phone] Office [phone]

## 2018-03-04 NOTE — Progress Notes (Addendum)
PROGRESS NOTE   Tiffany Frye  UVO:[id number]    DOB: September 03, 1955    DOA: 02/28/2018  PCP: System, Pcp Not In   I have briefly reviewed patients previous medical [facility] in [facility].  Brief Narrative:  63 year old female, lives alone, independent, PMH of HTN, HLD, GERD, depression, alcohol and tobacco abuse, per family mental status reportedly declining over the past 3 to 4 months, presented to ED 2/22 with worsening confusion over the past 7 days PTA.  She was admitted due to acute metabolic encephalopathy.   Assessment & Plan:   Principal Problem:   Acute metabolic encephalopathy Active Problems:   Alcohol abuse   High cholesterol   Hypertension   Hyponatremia   Hyperkalemia   Malnutrition of moderate degree   1. Acute metabolic encephalopathy: Unclear etiology.  CT head without acute findings.  Ammonia: 26, UDS positive for THC, UA not consistent with UTI, HIV screen negative, acute hepatitis panel negative, B1 level pending, TSH normal/1.94, folate low at 3.6, B12/1088, RPR nonreactive.  Suspected due to alcohol abuse, dehydration from poor oral intake complicating possible underlying dementia.  Discussed in detail with patient's daughter on 2/25, gradual decline in mental status over the last 3 months but abruptly in the few days preceding admission, mental status has improved but keeps repeating same questions that have been answered again and again, concern for other etiologies.  Check MRI brain to rule out stroke (patient and daughter deny metallic objects) 2. Anemia of chronic disease/folate deficiency: No bleeding reported.  Suspect anemia to be multifactorial related to bone marrow suppression from chronic alcohol abuse, folate deficiency versus others.  Anemia panel: Iron 99, TIBC 224, saturation ratio 44, ferritin 437, folate 3.6 and B12: 1088.  Folate supplementation.Had colonoscopy in 2017 when a couple of polyps were removed/tubular adenomas.  Recommend close outpatient  follow-up and evaluation as deemed necessary.  FOBT negative. 3. Alcohol dependence: Abstinence counseled.  Treated per CIWA protocol.  No overt withdrawal noted.  Continue thiamine, folate and multivitamins. 4. Tobacco abuse: Cessation counseled.  Nicotine patch. 5. Essential hypertension: Controlled.  Cozaar currently on hold, may consider resuming at discharge. 6. Hyperlipidemia: Statins held due to abnormal LFTs, may consider resuming at discharge. 7. GERD: Continue PPI. 8. Hyponatremia: Sodium 127 on admission.  Suspect multifactorial due to poor oral intake, associated dehydration, beer potomania.  Serum osmolarity 267, urine osmolarity 295 and urine sodium 21.  Improved and sodium has stabilized in the low 130s. 9. Hypokalemia/hypomagnesemia: Replaced.  Admission hyperkalemia due to spurious lab/hemolysis 10. Thrombocytopenia: Suspect due to alcohol abuse.  Low of 96, now improved to 131.  Follow CBCs. 11. Abnormal LFTs: Likely alcoholic hepatitis.  Acute hepatitis panel negative.  Slowly improving.  Alcohol abstinence. 12. Nonsevere (moderate) malnutrition in context of chronic illness: Management per ID.   DVT prophylaxis: Lovenox Code Status: Full Family Communication: Discussed in detail with patient's daughter 2/25. Disposition: DC to SNF pending bed availability.  PT evaluation 2/24 appreciated and due to decreased self-awareness, increased fall risk, lives alone and lack of assistance at home, PT recommends SNF.  Social work following.   Consultants:  None  Procedures:  None  Antimicrobials:  None   Subjective: Patient denies complaints.  States that she drinks 2-3 large cans of beer a few times a week but not daily, does not drink hard liquor, get the feeling that she is understating.  Also states that she smokes "less than a full pack" of cigarettes and denies drug abuse.  Reports that she lives alone and is independent.  As per RN, no acute issues noted.  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  Gradually worsening mental status over the past 3 months, at times has forgotten to go to work, abrupt decline in the few days PTA where she would keep repeating herself, disheveled hair, unable to put on her clothes correctly and putting wrong in the sleeve.  Daughter saw her this morning, confusion better but ongoing issues with repeating same questions that have been answered.  ROS: As above, otherwise negative.  Objective:  Vitals:   03/03/18 1647 03/03/18 2242 03/04/18 0622 03/04/18 0835  BP: 110/73 119/83 110/79 117/75  Pulse: 75 90 83 72  Resp: 20 18 18 20   Temp: 98 F (36.7 C) 98.5 F (36.9 C) 98.6 F (37 C) 97.8 F (36.6 C)  TempSrc: Oral Oral Oral Oral  SpO2: 100% 99% 100% 100%  Weight:        Examination:  General exam: Pleasant middle-aged female, moderately built and nourished lying comfortably propped up in bed without distress.  Oral mucosa moist. Respiratory system: Clear to auscultation. Respiratory effort normal. Cardiovascular system: S1 & S2 heard, RRR. No JVD, murmurs, rubs, gallops or clicks. No pedal edema. Gastrointestinal system: Abdomen is nondistended, soft and nontender. No organomegaly or masses felt. Normal bowel sounds heard. Central nervous system: Alert and oriented to person, place and partly to time. No focal neurological deficits.  No asterixis. Extremities: Symmetric 5 x 5 power. Skin: No rashes, lesions or ulcers Psychiatry: Judgement and insight appear somewhat impaired. Mood & affect flat.     Data Reviewed: I have personally reviewed following labs and imaging studies  CBC: Recent Labs  Lab 02/28/18 2208 03/02/18 0546 03/03/18 0401 03/04/18 0741  WBC 8.4 8.1 10.1 9.8  NEUTROABS 5.1  --   --   --   HGB 10.8* 7.2* 8.5* 8.2*  HCT 30.3* 20.8* 24.5* 23.7*  MCV 88.3 90.4 92.1 90.8  PLT 141* 96* 124* 740*   Basic Metabolic  Panel: Recent Labs  Lab 03/01/18 0450 03/01/18 0931 03/02/18 0546 03/03/18 0401 03/04/18 0741  NA 130* 130* 132* 133* 131*  K 3.9 4.0 3.1* 3.9 3.8  CL 99 104 104 107 103  CO2 19* 17* 18* 21* 20*  GLUCOSE 79 81 94 96 101*  BUN <5* <5* <5* <5* <5*  CREATININE 0.66 0.65 0.65 0.66 0.67  CALCIUM 8.8* 8.6* 7.6* 8.6* 8.6*  MG  --   --   --  1.5* 1.7   Liver Function Tests: Recent Labs  Lab 02/28/18 2208 03/03/18 0401 03/04/18 0741  AST 138* 72* 91*  ALT 72* 46* 52*  ALKPHOS 93 89 101  BILITOT 1.7* 0.9 0.4  PROT 6.9 5.8* 5.6*  ALBUMIN 3.3* 2.6* 2.6*   Cardiac Enzymes: Recent Labs  Lab 02/28/18 2208  TROPONINI <0.03   CBG: Recent Labs  Lab 02/28/18 2221 03/01/18 0750  GLUCAP 87 70    No results found for this or any previous visit (from the past 240 hour(s)).       Radiology Studies: No results found.      Scheduled Meds: . folic acid  1 mg Oral Daily  . LORazepam  0-4 mg Intravenous Q12H  . multivitamin with minerals  1 tablet Oral Daily  . nicotine  21 mg Transdermal Daily  . pantoprazole  40 mg Oral Daily  . thiamine  100 mg Oral Daily   Or  . thiamine  100 mg Intravenous Daily   Continuous Infusions: . sodium chloride Stopped (03/01/18 0138)     LOS: 1 day     Vernell Leep, MD, FACP, [facility]. [facility]  To contact the attending provider between 7A-7P or the covering provider during after hours 7P-7A, please log into the web site [email or web address] and access using universal [place] password for that web site. If you do not have the password, please call the hospital operator.  03/04/2018, 4:48 PM

## 2018-03-04 NOTE — NC FL2 (Signed)
[place] LEVEL OF CARE SCREENING TOOL     IDENTIFICATION  Patient Name: Tiffany Frye Birthdate: [birth year] Sex: female Admission Date (Current Location): 02/28/2018  [facility] and [place] Number:  Herbalist and Address:  [facility], [place] [address] [place], [place] [address]      Provider Number: [id number]  Attending Physician Name and Address:  Modena Jansky, MD  Relative Name and Phone Number:  Thereasa Solo, Daughter, [phone]    Current Level of Care: Hospital Recommended Level of Care: [place] Prior Approval Number:    Date Approved/Denied:   PASRR Number: [id number]  Discharge Plan: SNF    Current Diagnoses: Patient Active Problem List   Diagnosis Date Noted  . Malnutrition of moderate degree 03/03/2018  . Hyponatremia 03/01/2018  . Hyperkalemia 03/01/2018  . Acute metabolic encephalopathy 32/99/2426  . Alcohol abuse   . High cholesterol   . Hypertension     Orientation RESPIRATION BLADDER Height & Weight     Self, Time, Situation, Place  Normal Incontinent, External catheter Weight: 125 lb 3.2 oz (56.8 kg) Height:     BEHAVIORAL SYMPTOMS/MOOD NEUROLOGICAL BOWEL NUTRITION STATUS      Continent Diet(Regular room dirt, thin liquids)  AMBULATORY STATUS COMMUNICATION OF NEEDS Skin   Supervision Verbally Normal, Other (Comment)(MASD on buttocks and skin cracking on heel and elbow)                       Personal Care Assistance Level of Assistance  Bathing, Feeding, Dressing, Total care Bathing Assistance: Limited assistance Feeding assistance: Independent Dressing Assistance: Limited assistance Total Care Assistance: Limited assistance   Functional Limitations Info  Sight, Hearing, Speech Sight Info: Adequate Hearing Info: Adequate Speech Info: Adequate    SPECIAL CARE FACTORS FREQUENCY  PT (By licensed PT), OT (By licensed OT)     PT Frequency: 5x/wk OT Frequency: 5x/wk            Contractures Contractures Info: Not present    Additional Factors Info  Code Status, Allergies Code Status Info: Full Code Allergies Info:  Penicillins           Current Medications (03/04/2018):  This is the current hospital active medication list Current Facility-Administered Medications  Medication Dose Route Frequency Provider Last Rate Last Dose  . 0.9 %  sodium chloride infusion  250 mL Intravenous Continuous Sherwood Gambler, MD   Stopped at 03/01/18 0138  . folic acid (FOLVITE) tablet 1 mg  1 mg Oral Daily Ivor Costa, MD   1 mg at 03/04/18 8341  . hydrALAZINE (APRESOLINE) injection 5 mg  5 mg Intravenous Q2H PRN Ivor Costa, MD      . LORazepam (ATIVAN) injection 0-4 mg  0-4 mg Intravenous Huntley Dec, MD      . multivitamin with minerals tablet 1 tablet  1 tablet Oral Daily Ivor Costa, MD   1 tablet at 03/04/18 0830  . nicotine (NICODERM CQ - dosed in mg/24 hours) patch 21 mg  21 mg Transdermal Daily Ivor Costa, MD   21 mg at 03/04/18 0834  . ondansetron (ZOFRAN) tablet 4 mg  4 mg Oral Q6H PRN Ivor Costa, MD       Or  . ondansetron (ZOFRAN) injection 4 mg  4 mg Intravenous Q6H PRN Ivor Costa, MD      . pantoprazole (PROTONIX) EC tablet 40 mg  40 mg Oral Daily Ivor Costa, MD   40  mg at 03/04/18 0830  . thiamine (VITAMIN B-1) tablet 100 mg  100 mg Oral Daily Ivor Costa, MD   100 mg at 03/04/18 0830   Or  . thiamine (B-1) injection 100 mg  100 mg Intravenous Daily Ivor Costa, MD   100 mg at 03/01/18 1019     Discharge Medications: Please see discharge summary for a list of discharge medications.  Relevant Imaging Results:  Relevant Lab Results:   Additional Information SSN: [id number]  Philippa Chester Carra Brindley, LCSWA

## 2018-03-05 LAB — CBC
HCT: 21.5 % — ABNORMAL LOW (ref 36.0–46.0)
Hemoglobin: 7.4 g/dL — ABNORMAL LOW (ref 12.0–15.0)
MCH: 31 pg (ref 26.0–34.0)
MCHC: 34.4 g/dL (ref 30.0–36.0)
MCV: 90 fL (ref 80.0–100.0)
Platelets: 123 10*3/uL — ABNORMAL LOW (ref 150–400)
RBC: 2.39 MIL/uL — ABNORMAL LOW (ref 3.87–5.11)
RDW: 13.2 % (ref 11.5–15.5)
WBC: 9.3 10*3/uL (ref 4.0–10.5)
nRBC: 0 % (ref 0.0–0.2)

## 2018-03-05 LAB — COMPREHENSIVE METABOLIC PANEL
ALT: 44 U/L (ref 0–44)
AST: 70 U/L — ABNORMAL HIGH (ref 15–41)
Albumin: 2.3 g/dL — ABNORMAL LOW (ref 3.5–5.0)
Alkaline Phosphatase: 91 U/L (ref 38–126)
Anion gap: 6 (ref 5–15)
BUN: <5 mg/dL — ABNORMAL LOW (ref 8–23)
CO2: 23 mmol/L (ref 22–32)
Calcium: 8.4 mg/dL — ABNORMAL LOW (ref 8.9–10.3)
Chloride: 104 mmol/L (ref 98–111)
Creatinine, Ser: 0.69 mg/dL (ref 0.44–1.00)
GFR calc Af Amer: >60 mL/min (ref >60)
GFR calc non Af Amer: >60 mL/min (ref >60)
Glucose, Bld: 102 mg/dL — ABNORMAL HIGH (ref 70–99)
Potassium: 3.7 mmol/L (ref 3.5–5.1)
Sodium: 133 mmol/L — ABNORMAL LOW (ref 135–145)
Total Bilirubin: 0.4 mg/dL (ref 0.3–1.2)
Total Protein: 4.9 g/dL — ABNORMAL LOW (ref 6.5–8.1)

## 2018-03-05 MED ORDER — NICOTINE POLACRILEX 2 MG MT GUM
2.0000 mg | CHEWING_GUM | OROMUCOSAL | Status: DC | PRN
  Filled 2018-03-05: qty 1

## 2018-03-05 NOTE — Progress Notes (Signed)
Physical Therapy Treatment Patient Details Name: Tiffany Frye MRN: [id number] DOB: 1955-10-19 Today's Date: 03/05/2018    History of Present Illness Pt is a 63 y/o female with a PMH significant for HTN, ETOH abuse. She presents with AMS, and daughter states that mental status has been declining for months and acutely worsened in the past week. AMS felt to be related to heavy alcohol consumption and malnutrition. CT Head and CXR both negative for acute changes.    PT Comments    Pt progressing towards physical therapy goals. Confusion appears improved but still remains throughout session. Perseverating on drinking coffee throughout treatment session. Pt ambulating at a gross supervision level for safety. Anticipates d/c home today. Will continue to follow and progress as able per POC.     Follow Up Recommendations  Home health PT;Supervision/Assistance - 24 hour     Equipment Recommendations  Rolling walker with 5" wheels    Recommendations for Other Services       Precautions / Restrictions Precautions Precautions: Fall Restrictions Weight Bearing Restrictions: No    Mobility  Bed Mobility               General bed mobility comments: Pt sitting on BSC when PT arrived.   Transfers Overall transfer level: Needs assistance Equipment used: Rolling walker (2 wheeled) Transfers: Sit to/from Stand Sit to Stand: Supervision         General transfer comment: Light supervision provided for power-up to full stand.  Ambulation/Gait Ambulation/Gait assistance: Supervision Gait Distance (Feet): 200 Feet Assistive device: Rolling walker (2 wheeled) Gait Pattern/deviations: Step-through pattern;Decreased stride length;Trunk flexed Gait velocity: Decreased Gait velocity interpretation: <1.8 ft/sec, indicate of risk for recurrent falls General Gait Details: Slow but generally steady with RW for support.   Stairs             Wheelchair Mobility    Modified Rankin  (Stroke Patients Only)       Balance Overall balance assessment: Needs assistance Sitting-balance support: Feet supported;No upper extremity supported Sitting balance-Leahy Scale: Fair     Standing balance support: No upper extremity supported;During functional activity Standing balance-Leahy Scale: Poor Standing balance comment: Reliant on UE support for balance                            Cognition Arousal/Alertness: Awake/alert Behavior During Therapy: Impulsive;Restless Overall Cognitive Status: Impaired/Different from baseline Area of Impairment: Following commands;Safety/judgement;Awareness;Problem solving;Memory                     Memory: Decreased short-term memory Following Commands: Follows one step commands consistently;Follows one step commands with increased time;Follows multi-step commands inconsistently Safety/Judgement: Decreased awareness of safety;Decreased awareness of deficits Awareness: Intellectual Problem Solving: Slow processing;Requires verbal cues General Comments: Pt attempting to use call bell to phone her daughter. Repeating questions and statements throughout session .      Exercises      General Comments        Pertinent Vitals/Pain Pain Assessment: No/denies pain    Home Living                      Prior Function            PT Goals (current goals can now be found in the care plan section) Acute Rehab PT Goals Patient Stated Goal: "Get some coffee" PT Goal Formulation: With patient Time For Goal Achievement: 03/17/18 Potential to Achieve Goals: Good  Progress towards PT goals: Progressing toward goals    Frequency    Min 3X/week      PT Plan Discharge plan needs to be updated    Co-evaluation              AM-PAC PT "6 Clicks" Mobility   Outcome Measure  Help needed turning from your back to your side while in a flat bed without using bedrails?: None Help needed moving from lying on  your back to sitting on the side of a flat bed without using bedrails?: None Help needed moving to and from a bed to a chair (including a wheelchair)?: None Help needed standing up from a chair using your arms (e.g., wheelchair or bedside chair)?: None Help needed to walk in hospital room?: A Little Help needed climbing 3-5 steps with a railing? : A Little 6 Click Score: 22    End of Session Equipment Utilized During Treatment: Gait belt Activity Tolerance: Patient tolerated treatment well Patient left: in bed;with call bell/phone within reach;with bed alarm set Nurse Communication: Mobility status PT Visit Diagnosis: Unsteadiness on feet (R26.81);Muscle weakness (generalized) (M62.81);Difficulty in walking, not elsewhere classified (R26.2)     Time: 1030-1047 PT Time Calculation (min) (ACUTE ONLY): 17 min  Charges:  $Gait Training: 8-22 mins                     Rolinda Roan, PT, DPT Acute Rehabilitation Services Pager: [phone] Office: [phone]    Thelma Comp 03/05/2018, 11:06 AM

## 2018-03-05 NOTE — Progress Notes (Signed)
PROGRESS NOTE    Tiffany Frye  GXQ:[id number] DOB: 12-01-1955 DOA: 02/28/2018 PCP: Camille Bal, PA-C   Brief Narrative:  63 year old female, lives alone, independent, PMH of HTN, HLD, GERD, depression, alcohol and tobacco abuse, per family mental status reportedly declining over the past 3 to 4 months, presented to ED 2/22 with worsening confusion over the past 7 days PTA.  She was admitted due to acute metabolic encephalopathy.  Assessment & Plan:   Principal Problem:   Acute metabolic encephalopathy Active Problems:   Alcohol abuse   High cholesterol   Hypertension   Hyponatremia   Hyperkalemia   Malnutrition of moderate degree  1. Acute metabolic encephalopathy: Unclear etiology.  CT head without acute findings.  Ammonia: 26, UDS positive for THC, UA not consistent with UTI, HIV screen negative, acute hepatitis panel negative, B1 level pending, TSH normal/1.94, folate low at 3.6, B12/1088, RPR nonreactive.  Suspected due to alcohol abuse, dehydration from poor oral intake complicating possible underlying dementia.  Discussed in detail with patient's daughter on 2/25, gradual decline in mental status over the last 3 months but abruptly in the few days preceding admission, mental status has improved but keeps repeating same questions that have been answered again and again.  She seems improved on 2/26.  Suspect delirium as well. 1. Continue thiamine 2. Delirium precautions 3. Outpatient follow up with neurology 4. MRI without acute intracranial process (mod to severe parenchymal brain volume loss, severe white matter changes most compatible with chronic small vessel ischemic changes).   2. Anemia of chronic disease/folate deficiency: No bleeding reported.  Suspect anemia to be multifactorial related to bone marrow suppression from chronic alcohol abuse, folate deficiency versus others.  1. Fluctuating over past few days.  Lower than at admission.  ? Dilution with IVF.  Continue to  monitor.  2. Labs suggestive of AOCD with folate deficiency  3. Needs further outpatient follow up  4. Colonoscopy 2017 with tubular adenomas 5. FOBT negative  3. Alcohol dependence: Abstinence counseled.  Treated per CIWA protocol.  No overt withdrawal noted.  Continue thiamine, folate and multivitamins.  4. Tobacco abuse: Cessation counseled.  Nicotine patch.  5. Essential hypertension: Controlled.  Cozaar currently on hold, may consider resuming at discharge.  6. Hyperlipidemia: Statins held due to abnormal LFTs, may consider resuming at discharge.  7. GERD: Continue PPI.  8. Hyponatremia: Sodium 127 on admission.  Suspect multifactorial due to poor oral intake, associated dehydration, beer potomania.  Improved.  9. Hypokalemia/hypomagnesemia: Replaced.  improved.  Will continue to follow.  10. Thrombocytopenia: Suspect due to alcohol abuse.  Continue to monitor.  11. Abnormal LFTs: Likely 2/2 etoh use.  Acute hepatitis panel negative.  Slowly improving.  Alcohol abstinence.  Continue to monitor.  Improving.  Follow LFTs, INR.    12. Nonsevere (moderate) malnutrition in context of chronic illness: dietician c/s  DVT prophylaxis: SCD Code Status: full  Family Communication: daughter, Tiffany Frye at bedside Disposition Plan: pending SNF placement given need for 24 hour care   Consultants:   none  Procedures:   none  Antimicrobials:  Anti-infectives (From admission, onward)   None      Subjective: Feels better. Per nurse, pt Vinaya Sancho bit confused this AM. Nikola Blackston&Ox2 today. Daughter at bedside ~2 PM.  Saw pt with daughter who noted she looked improved.  No reliable 24 hr care at home.   Objective: Vitals:   03/04/18 0835 03/04/18 2000 03/05/18 0452 03/05/18 0835  BP: 117/75 96/63 124/66 124/61  Pulse: 72 82 82 89  Resp: 20 20 18 18   Temp: 97.8 F (36.6 C) 98.4 F (36.9 C) 98.1 F (36.7 C) 98.3 F (36.8 C)  TempSrc: Oral Oral Oral Oral  SpO2: 100% 100% 100% 100%    Weight:  57.1 kg      Intake/Output Summary (Last 24 hours) at 03/05/2018 1419 Last data filed at 03/05/2018 1341 Gross per 24 hour  Intake 600 ml  Output 351 ml  Net 249 ml   Filed Weights   03/03/18 1104 03/04/18 2000  Weight: 56.8 kg 57.1 kg    Examination:  General exam: Appears calm and comfortable  Respiratory system: Clear to auscultation. Respiratory effort normal. Cardiovascular system: S1 & S2 heard, RRR.  Gastrointestinal system: Abdomen is nondistended, soft and nontender. Central nervous system: Alert and oriented x2. Moving all extremities.  No appreciable deficits. Extremities: no LEE Skin: No rashes, lesions or ulcers Psychiatry: Judgement and insight appear normal. Mood & affect appropriate.     Data Reviewed: I have personally reviewed following labs and imaging studies  CBC: Recent Labs  Lab 02/28/18 2208 03/02/18 0546 03/03/18 0401 03/04/18 0741 03/05/18 0351  WBC 8.4 8.1 10.1 9.8 9.3  NEUTROABS 5.1  --   --   --   --   HGB 10.8* 7.2* 8.5* 8.2* 7.4*  HCT 30.3* 20.8* 24.5* 23.7* 21.5*  MCV 88.3 90.4 92.1 90.8 90.0  PLT 141* 96* 124* 131* 237*   Basic Metabolic Panel: Recent Labs  Lab 03/01/18 0931 03/02/18 0546 03/03/18 0401 03/04/18 0741 03/05/18 0351  NA 130* 132* 133* 131* 133*  K 4.0 3.1* 3.9 3.8 3.7  CL 104 104 107 103 104  CO2 17* 18* 21* 20* 23  GLUCOSE 81 94 96 101* 102*  BUN <5* <5* <5* <5* <5*  CREATININE 0.65 0.65 0.66 0.67 0.69  CALCIUM 8.6* 7.6* 8.6* 8.6* 8.4*  MG  --   --  1.5* 1.7  --    GFR: CrCl cannot be calculated (Unknown ideal weight.). Liver Function Tests: Recent Labs  Lab 02/28/18 2208 03/03/18 0401 03/04/18 0741 03/05/18 0351  AST 138* 72* 91* 70*  ALT 72* 46* 52* 44  ALKPHOS 93 89 101 91  BILITOT 1.7* 0.9 0.4 0.4  PROT 6.9 5.8* 5.6* 4.9*  ALBUMIN 3.3* 2.6* 2.6* 2.3*   No results for input(s): LIPASE, AMYLASE in the last 168 hours. Recent Labs  Lab 02/28/18 2208  AMMONIA 26   Coagulation  Profile: No results for input(s): INR, PROTIME in the last 168 hours. Cardiac Enzymes: Recent Labs  Lab 02/28/18 2208  TROPONINI <0.03   BNP (last 3 results) No results for input(s): PROBNP in the last 8760 hours. HbA1C: No results for input(s): HGBA1C in the last 72 hours. CBG: Recent Labs  Lab 02/28/18 2221 03/01/18 0750  GLUCAP 87 70   Lipid Profile: No results for input(s): CHOL, HDL, LDLCALC, TRIG, CHOLHDL, LDLDIRECT in the last 72 hours. Thyroid Function Tests: No results for input(s): TSH, T4TOTAL, FREET4, T3FREE, THYROIDAB in the last 72 hours. Anemia Panel: Recent Labs    03/03/18 0401  FERRITIN 437*  TIBC 224*  IRON 99   Sepsis Labs: No results for input(s): PROCALCITON, LATICACIDVEN in the last 168 hours.  No results found for this or any previous visit (from the past 240 hour(s)).       Radiology Studies: Mr Brain Wo Contrast  Result Date: 03/04/2018 CLINICAL DATA:  Worsening cognitive decline, confusion for 1 week. Evaluate acute metabolic  encephalopathy. History of alcohol abuse, hypertension hypercholesterolemia. EXAM: MRI HEAD WITHOUT CONTRAST TECHNIQUE: Multiplanar, multiecho pulse sequences of the brain and surrounding structures were obtained without intravenous contrast. COMPARISON:  CT HEAD February 28, 2018. FINDINGS: INTRACRANIAL CONTENTS: No reduced diffusion to suggest acute ischemia, status epilepticus or infection. No susceptibility artifact to suggest hemorrhage. Moderate to severe global parenchymal brain volume loss for age. No hydrocephalus. Confluent supratentorial and pontine white matter FLAIR T2 hyperintensities. Patchy T2 hyperintensities bilateral basal ganglia associated with chronic small vessel ischemic changes. No suspicious parenchymal signal, masses, mass effect. No abnormal extra-axial fluid collections. No extra-axial masses. VASCULAR: Normal major intracranial vascular flow voids present at skull base. SKULL AND UPPER CERVICAL  SPINE: No abnormal sellar expansion. No suspicious calvarial bone marrow signal. Craniocervical junction maintained. SINUSES/ORBITS: Minimal LEFT ethmoid mucosal thickening.The included ocular globes and orbital contents are non-suspicious. OTHER: None. IMPRESSION: 1. No acute intracranial process. 2. Moderate to severe parenchymal brain volume loss, advanced for age. 3. Severe white matter changes most compatible chronic small vessel ischemic changes though there could be Kamen Hanken component of metabolic/toxic leukoencephalopathy. Electronically Signed   By: Elon Alas M.D.   On: 03/04/2018 22:23        Scheduled Meds: . folic acid  1 mg Oral Daily  . multivitamin with minerals  1 tablet Oral Daily  . nicotine  21 mg Transdermal Daily  . pantoprazole  40 mg Oral Daily  . thiamine  100 mg Oral Daily   Or  . thiamine  100 mg Intravenous Daily   Continuous Infusions: . sodium chloride Stopped (03/01/18 0138)     LOS: 2 days    Time spent: over 31 min    Fayrene Helper, MD [facility] Pager AMION  If 7PM-7AM, please contact night-coverage [email or web address] Password [facility] 03/05/2018, 2:19 PM

## 2018-03-06 LAB — COMPREHENSIVE METABOLIC PANEL
ALT: 43 U/L (ref 0–44)
AST: 67 U/L — ABNORMAL HIGH (ref 15–41)
Albumin: 2.4 g/dL — ABNORMAL LOW (ref 3.5–5.0)
Alkaline Phosphatase: 86 U/L (ref 38–126)
Anion gap: 7 (ref 5–15)
BUN: <5 mg/dL — ABNORMAL LOW (ref 8–23)
CO2: 21 mmol/L — ABNORMAL LOW (ref 22–32)
Calcium: 8.7 mg/dL — ABNORMAL LOW (ref 8.9–10.3)
Chloride: 105 mmol/L (ref 98–111)
Creatinine, Ser: 0.59 mg/dL (ref 0.44–1.00)
GFR calc Af Amer: >60 mL/min (ref >60)
GFR calc non Af Amer: >60 mL/min (ref >60)
Glucose, Bld: 91 mg/dL (ref 70–99)
Potassium: 3.7 mmol/L (ref 3.5–5.1)
Sodium: 133 mmol/L — ABNORMAL LOW (ref 135–145)
Total Bilirubin: 0.9 mg/dL (ref 0.3–1.2)
Total Protein: 5.3 g/dL — ABNORMAL LOW (ref 6.5–8.1)

## 2018-03-06 LAB — CBC
HCT: 21.5 % — ABNORMAL LOW (ref 36.0–46.0)
Hemoglobin: 7.5 g/dL — ABNORMAL LOW (ref 12.0–15.0)
MCH: 31.1 pg (ref 26.0–34.0)
MCHC: 34.9 g/dL (ref 30.0–36.0)
MCV: 89.2 fL (ref 80.0–100.0)
Platelets: 146 10*3/uL — ABNORMAL LOW (ref 150–400)
RBC: 2.41 MIL/uL — ABNORMAL LOW (ref 3.87–5.11)
RDW: 13.3 % (ref 11.5–15.5)
WBC: 9.4 10*3/uL (ref 4.0–10.5)
nRBC: 0 % (ref 0.0–0.2)

## 2018-03-06 LAB — PROTIME-INR
INR: 1.1 (ref 0.8–1.2)
Prothrombin Time: 14 seconds (ref 11.4–15.2)

## 2018-03-06 LAB — VITAMIN B1: Vitamin B1 (Thiamine): 24.6 nmol/L — ABNORMAL LOW (ref 66.5–200.0)

## 2018-03-06 NOTE — Care Management Note (Signed)
Case Management Note Manya Silvas, RN MSN CCM Transitions of Care [address] [place] [phone]  Patient Details  Name: Tiffany Frye MRN: [id number] Date of Birth: [birth year]  Subjective/Objective:  Acute metabolic encephalopathy                 Action/Plan: Spoke with patient at bedside. PTA home. Patient stating she does not want to go to SNF. She states her son is there with her. Discussed choice for HH-Patient chose [facility] for PT/OT-referral accepted by [facility]. DME-RW-referral made to [facility]. PCP- Wenda Low, MD. Patient reports no transportation or medication issues. CM to follow for transition of care needs.   Expected Discharge Date:                  Expected Discharge Plan:  [place]  In-House Referral:  Clinical Social Work  Discharge planning Services  CM Consult  Post Acute Care Choice:  Durable Medical Equipment Choice offered to:  Patient  DME Arranged:  Walker rolling DME Agency:  [place] Arranged:  OT, PT [facility] Agency:  [place]  Status of Service:  In process, will continue to follow  If discussed at Long Length of Stay Meetings, dates discussed:    Additional Comments:  Bartholomew Crews, RN 03/06/2018, 11:32 AM

## 2018-03-06 NOTE — Progress Notes (Signed)
Physical Therapy Treatment Patient Details Name: Tiffany Frye MRN: [id number] DOB: 1955/03/08 Today's Date: 03/06/2018    History of Present Illness Pt is a 63 y/o female with a PMH significant for HTN, ETOH abuse. She presents with AMS, and daughter states that mental status has been declining for months and acutely worsened in the past week. AMS felt to be related to heavy alcohol consumption and malnutrition. CT Head and CXR both negative for acute changes.    PT Comments    Pt progressing towards physical therapy goals. Was able to mobilize around room well with RW and performed toileting, peri-care, and hand hygiene with min assist for gown management, and VC's for sequencing. Will need someone at home 24 hours, as pt continues to be altered and requires supervision for safety. Confirmed with pt and case manager that plan is for home with [facility] therapies to follow-up. Will continue to follow and progress as able per POC.     Follow Up Recommendations  Home health PT;Supervision/Assistance - 24 hour     Equipment Recommendations  Rolling walker with 5" wheels    Recommendations for Other Services       Precautions / Restrictions Precautions Precautions: Fall Restrictions Weight Bearing Restrictions: No    Mobility  Bed Mobility Overal bed mobility: Modified Independent Bed Mobility: Supine to Sit;Sit to Supine           General bed mobility comments: HOB flat and min use of rails.   Transfers Overall transfer level: Needs assistance Equipment used: Rolling walker (2 wheeled) Transfers: Sit to/from Stand Sit to Stand: Supervision         General transfer comment: Light supervision provided for power-up to full stand.  Ambulation/Gait Ambulation/Gait assistance: Supervision Gait Distance (Feet): 300 Feet(hallway ambulation and ambulation in room) Assistive device: Rolling walker (2 wheeled) Gait Pattern/deviations: Step-through pattern;Decreased stride length;Trunk  flexed Gait velocity: Decreased Gait velocity interpretation: <1.8 ft/sec, indicate of risk for recurrent falls General Gait Details: Slow but generally steady with RW for support.   Stairs             Wheelchair Mobility    Modified Rankin (Stroke Patients Only)       Balance Overall balance assessment: Needs assistance Sitting-balance support: Feet supported;No upper extremity supported Sitting balance-Leahy Scale: Fair     Standing balance support: No upper extremity supported;During functional activity Standing balance-Leahy Scale: Fair Standing balance comment: Able to stand at sink and wash hands/wip bottom without UE support.                             Cognition Arousal/Alertness: Awake/alert Behavior During Therapy: Impulsive;Restless Overall Cognitive Status: Impaired/Different from baseline Area of Impairment: Following commands;Safety/judgement;Awareness;Problem solving;Memory                 Orientation Level: Disoriented to;Place;Time;Situation   Memory: Decreased short-term memory Following Commands: Follows one step commands consistently;Follows one step commands with increased time;Follows multi-step commands inconsistently Safety/Judgement: Decreased awareness of safety;Decreased awareness of deficits Awareness: Intellectual Problem Solving: Slow processing;Requires verbal cues General Comments: Remains altered      Exercises      General Comments        Pertinent Vitals/Pain Pain Assessment: No/denies pain    Home Living                      Prior Function  PT Goals (current goals can now be found in the care plan section) Acute Rehab PT Goals Patient Stated Goal: "Get some coffee" PT Goal Formulation: With patient Time For Goal Achievement: 03/17/18 Potential to Achieve Goals: Good Progress towards PT goals: Progressing toward goals    Frequency    Min 3X/week      PT Plan Current  plan remains appropriate    Co-evaluation              AM-PAC PT "6 Clicks" Mobility   Outcome Measure  Help needed turning from your back to your side while in a flat bed without using bedrails?: None Help needed moving from lying on your back to sitting on the side of a flat bed without using bedrails?: None Help needed moving to and from a bed to a chair (including a wheelchair)?: None Help needed standing up from a chair using your arms (e.g., wheelchair or bedside chair)?: None Help needed to walk in hospital room?: None Help needed climbing 3-5 steps with a railing? : A Little 6 Click Score: 23    End of Session Equipment Utilized During Treatment: Gait belt Activity Tolerance: Patient tolerated treatment well Patient left: in bed;with call bell/phone within reach;with bed alarm set Nurse Communication: Mobility status PT Visit Diagnosis: Unsteadiness on feet (R26.81);Muscle weakness (generalized) (M62.81);Difficulty in walking, not elsewhere classified (R26.2)     Time: 8182-9937 PT Time Calculation (min) (ACUTE ONLY): 31 min  Charges:  $Gait Training: 23-37 mins                     Rolinda Roan, PT, DPT Acute Rehabilitation Services Pager: [phone] Office: [phone]    Tiffany Frye 03/06/2018, 1:38 PM

## 2018-03-06 NOTE — Progress Notes (Signed)
PROGRESS NOTE    Tiffany Frye  DJM:[id number] DOB: 03-12-1955 DOA: 02/28/2018 PCP: Tiffany Low, MD   Brief Narrative:  63 year old female, lives alone, independent, PMH of HTN, HLD, GERD, depression, alcohol and tobacco abuse, per family mental status reportedly declining over the past 3 to 4 months, presented to ED 2/22 with worsening confusion over the past 7 days PTA.  She was admitted due to acute metabolic encephalopathy.  Assessment & Plan:   Principal Problem:   Acute metabolic encephalopathy Active Problems:   Alcohol abuse   High cholesterol   Hypertension   Hyponatremia   Hyperkalemia   Malnutrition of moderate degree  1. Acute metabolic encephalopathy: Unclear etiology.  CT head without acute findings.  Ammonia: 26, UDS positive for THC, UA not consistent with UTI, HIV screen negative, acute hepatitis panel negative, B1 level Frye 24.6, TSH normal/1.94, folate Frye at 3.6, B12/1088, RPR nonreactive.  Suspected due to alcohol abuse, dehydration from poor oral intake complicating possible underlying dementia.  Discussed in detail with patient's daughter on 2/25, gradual decline in mental status over the last 3 months but abruptly in the few days preceding admission, mental status has improved but keeps repeating same questions that have been answered again and again.  She seems improved on 2/26.  Suspect delirium as well. 1. Continue thiamine, deficiency as noted above 2. Delirium precautions 3. Outpatient follow up with neurology 4. MRI without acute intracranial process (mod to severe parenchymal brain volume loss, severe white matter changes most compatible with chronic small vessel ischemic changes).   2. Anemia of chronic disease/folate deficiency: No bleeding reported.  Suspect anemia to be multifactorial related to bone marrow suppression from chronic alcohol abuse, folate deficiency versus others.  1. Fluctuating over past few days.  Lower than at admission.  ? Dilution  with IVF.  Continue to monitor.  2. Labs suggestive of AOCD with folate deficiency  3. Needs further outpatient follow up  4. Colonoscopy 2017 with tubular adenomas 5. FOBT negative  3. Alcohol dependence: Abstinence counseled.  Treated per CIWA protocol.  No overt withdrawal noted.  Continue thiamine, folate and multivitamins.  4. Tobacco abuse: Cessation counseled.  Nicotine patch.  5. Essential hypertension: Controlled.  Cozaar currently on hold, may consider resuming at discharge.  6. Hyperlipidemia: Statins held due to abnormal LFTs, may consider resuming at discharge.  7. GERD: Continue PPI.  8. Hyponatremia: Sodium 127 on admission.  Suspect multifactorial due to poor oral intake, associated dehydration, beer potomania.  Improved.  9. Hypokalemia/hypomagnesemia: Replaced.  improved.  Will continue to follow.  10. Thrombocytopenia: Suspect due to alcohol abuse.  Continue to monitor.  Improving.  11. Abnormal LFTs: Likely 2/2 etoh use.  Acute hepatitis panel negative.  Slowly improving.  Alcohol abstinence.  Continue to monitor.  Improving.  Follow LFTs, INR.    12. Nonsevere (moderate) malnutrition in context of chronic illness: dietician c/s  DVT prophylaxis: SCD Code Status: full  Family Communication: daughter, Tiffany Frye over phone Disposition Plan: pending SNF placement given need for 24 hour care   Consultants:   none  Procedures:   none  Antimicrobials:  Anti-infectives (From admission, onward)   None      Subjective: No complaints today. Willing to go to SNF.  Objective: Vitals:   03/06/18 0635 03/06/18 0902 03/06/18 1215 03/06/18 1622  BP: 127/73 (!) 126/59 109/63 121/65  Pulse: 79 77 86 75  Resp: 18 20  20   Temp: 98.7 F (37.1 C) 98.6 F (37 C)  99.3 F (37.4 C)  TempSrc: Oral Oral  Oral  SpO2: 100% 100%  100%  Weight:        Intake/Output Summary (Last 24 hours) at 03/06/2018 1704 Last data filed at 03/06/2018 1006 Gross per 24 hour    Intake 598 ml  Output 300 ml  Net 298 ml   Filed Weights   03/03/18 1104 03/04/18 2000  Weight: 56.8 kg 57.1 kg    Examination:  General: No acute distress. Cardiovascular: Heart sounds show Tiffany Frye regular rate, and rhythm. Lungs: Clear to auscultation bilaterally Abdomen: Soft, nontender, nondistended  Neurological: Alert and oriented. Moves all extremities 4. Skin: Warm and dry. No rashes or lesions. Extremities: No clubbing or cyanosis. No edema.     Data Reviewed: I have personally reviewed following labs and imaging studies  CBC: Recent Labs  Lab 02/28/18 2208 03/02/18 0546 03/03/18 0401 03/04/18 0741 03/05/18 0351 03/06/18 0424  WBC 8.4 8.1 10.1 9.8 9.3 9.4  NEUTROABS 5.1  --   --   --   --   --   HGB 10.8* 7.2* 8.5* 8.2* 7.4* 7.5*  HCT 30.3* 20.8* 24.5* 23.7* 21.5* 21.5*  MCV 88.3 90.4 92.1 90.8 90.0 89.2  PLT 141* 96* 124* 131* 123* 712*   Basic Metabolic Panel: Recent Labs  Lab 03/02/18 0546 03/03/18 0401 03/04/18 0741 03/05/18 0351 03/06/18 0424  NA 132* 133* 131* 133* 133*  K 3.1* 3.9 3.8 3.7 3.7  CL 104 107 103 104 105  CO2 18* 21* 20* 23 21*  GLUCOSE 94 96 101* 102* 91  BUN <5* <5* <5* <5* <5*  CREATININE 0.65 0.66 0.67 0.69 0.59  CALCIUM 7.6* 8.6* 8.6* 8.4* 8.7*  MG  --  1.5* 1.7  --   --    GFR: CrCl cannot be calculated (Unknown ideal weight.). Liver Function Tests: Recent Labs  Lab 02/28/18 2208 03/03/18 0401 03/04/18 0741 03/05/18 0351 03/06/18 0424  AST 138* 72* 91* 70* 67*  ALT 72* 46* 52* 44 43  ALKPHOS 93 89 101 91 86  BILITOT 1.7* 0.9 0.4 0.4 0.9  PROT 6.9 5.8* 5.6* 4.9* 5.3*  ALBUMIN 3.3* 2.6* 2.6* 2.3* 2.4*   No results for input(s): LIPASE, AMYLASE in the last 168 hours. Recent Labs  Lab 02/28/18 2208  AMMONIA 26   Coagulation Profile: Recent Labs  Lab 03/06/18 0424  INR 1.1   Cardiac Enzymes: Recent Labs  Lab 02/28/18 2208  TROPONINI <0.03   BNP (last 3 results) No results for input(s): PROBNP in the  last 8760 hours. HbA1C: No results for input(s): HGBA1C in the last 72 hours. CBG: Recent Labs  Lab 02/28/18 2221 03/01/18 0750  GLUCAP 87 70   Lipid Profile: No results for input(s): CHOL, HDL, LDLCALC, TRIG, CHOLHDL, LDLDIRECT in the last 72 hours. Thyroid Function Tests: No results for input(s): TSH, T4TOTAL, FREET4, T3FREE, THYROIDAB in the last 72 hours. Anemia Panel: No results for input(s): VITAMINB12, FOLATE, FERRITIN, TIBC, IRON, RETICCTPCT in the last 72 hours. Sepsis Labs: No results for input(s): PROCALCITON, LATICACIDVEN in the last 168 hours.  No results found for this or any previous visit (from the past 240 hour(s)).       Radiology Studies: Mr Brain Wo Contrast  Result Date: 03/04/2018 CLINICAL DATA:  Worsening cognitive decline, confusion for 1 week. Evaluate acute metabolic encephalopathy. History of alcohol abuse, hypertension hypercholesterolemia. EXAM: MRI HEAD WITHOUT CONTRAST TECHNIQUE: Multiplanar, multiecho pulse sequences of the brain and surrounding structures were obtained without intravenous contrast. COMPARISON:  CT HEAD February 28, 2018. FINDINGS: INTRACRANIAL CONTENTS: No reduced diffusion to suggest acute ischemia, status epilepticus or infection. No susceptibility artifact to suggest hemorrhage. Moderate to severe global parenchymal brain volume loss for age. No hydrocephalus. Confluent supratentorial and pontine white matter FLAIR T2 hyperintensities. Patchy T2 hyperintensities bilateral basal ganglia associated with chronic small vessel ischemic changes. No suspicious parenchymal signal, masses, mass effect. No abnormal extra-axial fluid collections. No extra-axial masses. VASCULAR: Normal major intracranial vascular flow voids present at skull base. SKULL AND UPPER CERVICAL SPINE: No abnormal sellar expansion. No suspicious calvarial bone marrow signal. Craniocervical junction maintained. SINUSES/ORBITS: Minimal LEFT ethmoid mucosal thickening.The  included ocular globes and orbital contents are non-suspicious. OTHER: None. IMPRESSION: 1. No acute intracranial process. 2. Moderate to severe parenchymal brain volume loss, advanced for age. 3. Severe white matter changes most compatible chronic small vessel ischemic changes though there could be Antionetta Ator component of metabolic/toxic leukoencephalopathy. Electronically Signed   By: Elon Alas M.D.   On: 03/04/2018 22:23        Scheduled Meds: . folic acid  1 mg Oral Daily  . multivitamin with minerals  1 tablet Oral Daily  . nicotine  21 mg Transdermal Daily  . pantoprazole  40 mg Oral Daily  . thiamine  100 mg Oral Daily   Continuous Infusions: . sodium chloride Stopped (03/01/18 0138)     LOS: 3 days    Time spent: over 68 min    Fayrene Helper, MD [facility] Pager AMION  If 7PM-7AM, please contact night-coverage [email or web address] Password [facility] 03/06/2018, 5:04 PM

## 2018-03-06 NOTE — Care Management Note (Signed)
Case Management Note Manya Silvas, RN MSN CCM Transitions of Care [address] [place] [phone]  Patient Details  Name: Tiffany Frye MRN: [id number] Date of Birth: 1955/05/17  Subjective/Objective:  Acute metabolic encephalopathy                 Action/Plan: 03/06/2018-Update-Continue SW consult for SNF placement. [facility] aware of patient current disposition.    03/06/2018-Spoke with patient at bedside. PTA home. Patient stating she does not want to go to SNF. She states her son is there with her. Discussed choice for HH-Patient chose [facility] for PT/OT-referral accepted by [facility]. DME-RW-referral made to [facility]. PCP- Wenda Low, MD. Patient reports no transportation or medication issues. CM to follow for transition of care needs.   Expected Discharge Date:                  Expected Discharge Plan:  [place]  In-House Referral:  Clinical Social Work  Discharge planning Services  CM Consult  Post Acute Care Choice:  Durable Medical Equipment Choice offered to:  Patient  DME Arranged:  [place] DME Agency:  [place] Arranged:  OT, PT [facility] Agency:  [place]  Status of Service:  In process, will continue to follow  If discussed at Long Length of Stay Meetings, dates discussed:    Additional Comments:  Bartholomew Crews, RN 03/06/2018, 2:39 PM

## 2018-03-06 NOTE — Care Management Note (Addendum)
Case Management Note Manya Silvas, RN MSN CCM Transitions of Care [address] [place] [phone]  Patient Details  Name: Tiffany Frye MRN: [id number] Date of Birth: 1955-10-12  Subjective/Objective:   Acute metabolic encephalopathy             Action/Plan: Spoke with daughter, Tiffany Frye, at bedside. She states that she is patient's POA including MPOA. Requested that she bring in copy. [name] stated that she would bring in paperwork tomorrow. Discussed plan for SNF. Advised that she would be hearing from CSW. [name] asked if [facility] could be an option. [name] advised that she keeps her phone with her at all times and to call her if needed. CM to follow for transition of care needs.   Expected Discharge Date:                  Expected Discharge Plan:  Skilled Nursing Facility  In-House Referral:  Clinical Social Work  Discharge planning Services  CM Consult  Post Acute Care Choice:  NA Choice offered to:  NA  DME Arranged:  N/A DME Agency:  NA  HH Arranged:  NA HH Agency:  NA  Status of Service:  In process, will continue to follow  If discussed at Long Length of Stay Meetings, dates discussed:    Additional Comments:  Bartholomew Crews, RN 03/06/2018, 3:03 PM

## 2018-03-07 LAB — COMPREHENSIVE METABOLIC PANEL
ALT: 39 U/L (ref 0–44)
AST: 62 U/L — ABNORMAL HIGH (ref 15–41)
Albumin: 2.4 g/dL — ABNORMAL LOW (ref 3.5–5.0)
Alkaline Phosphatase: 70 U/L (ref 38–126)
Anion gap: 7 (ref 5–15)
BUN: 5 mg/dL — ABNORMAL LOW (ref 8–23)
CO2: 21 mmol/L — ABNORMAL LOW (ref 22–32)
Calcium: 9 mg/dL (ref 8.9–10.3)
Chloride: 108 mmol/L (ref 98–111)
Creatinine, Ser: 0.52 mg/dL (ref 0.44–1.00)
GFR calc Af Amer: >60 mL/min (ref >60)
GFR calc non Af Amer: >60 mL/min (ref >60)
Glucose, Bld: 84 mg/dL (ref 70–99)
Potassium: 3.8 mmol/L (ref 3.5–5.1)
Sodium: 136 mmol/L (ref 135–145)
Total Bilirubin: 0.4 mg/dL (ref 0.3–1.2)
Total Protein: 5.2 g/dL — ABNORMAL LOW (ref 6.5–8.1)

## 2018-03-07 LAB — CBC
HCT: 21.2 % — ABNORMAL LOW (ref 36.0–46.0)
Hemoglobin: 7.3 g/dL — ABNORMAL LOW (ref 12.0–15.0)
MCH: 31.2 pg (ref 26.0–34.0)
MCHC: 34.4 g/dL (ref 30.0–36.0)
MCV: 90.6 fL (ref 80.0–100.0)
Platelets: 156 10*3/uL (ref 150–400)
RBC: 2.34 MIL/uL — ABNORMAL LOW (ref 3.87–5.11)
RDW: 13.9 % (ref 11.5–15.5)
WBC: 9.4 10*3/uL (ref 4.0–10.5)
nRBC: 0 % (ref 0.0–0.2)

## 2018-03-07 LAB — MAGNESIUM: Magnesium: 1.5 mg/dL — ABNORMAL LOW (ref 1.7–2.4)

## 2018-03-07 MED ORDER — MAGNESIUM SULFATE 2 GM/50ML IV SOLN
2.0000 g | Freq: Once | INTRAVENOUS | Status: AC
  Administered 2018-03-07: 2 g via INTRAVENOUS
  Filled 2018-03-07: qty 50

## 2018-03-07 MED ORDER — ENSURE ENLIVE PO LIQD
237.0000 mL | Freq: Two times a day (BID) | ORAL | Status: DC
  Administered 2018-03-07 – 2018-03-13 (×8): 237 mL via ORAL

## 2018-03-07 MED ORDER — THIAMINE HCL 100 MG/ML IJ SOLN
500.0000 mg | Freq: Three times a day (TID) | INTRAVENOUS | Status: DC
  Administered 2018-03-07: 500 mg via INTRAVENOUS
  Filled 2018-03-07 (×4): qty 5

## 2018-03-07 NOTE — Progress Notes (Signed)
PROGRESS NOTE    Nara Paternoster  UDJ:[id number] DOB: [birth year] DOA: 02/28/2018 PCP: Wenda Low, MD   Brief Narrative:  63 year old female, lives alone, independent, PMH of HTN, HLD, GERD, depression, alcohol and tobacco abuse, per family mental status reportedly declining over the past 3 to 4 months, presented to ED 2/22 with worsening confusion over the past 7 days PTA.  She was admitted due to acute metabolic encephalopathy.  Assessment & Plan:   Principal Problem:   Acute metabolic encephalopathy Active Problems:   Alcohol abuse   High cholesterol   Hypertension   Hyponatremia   Hyperkalemia   Malnutrition of moderate degree  1. Acute metabolic encephalopathy: Unclear etiology.  CT head without acute findings.  Ammonia: 26, UDS positive for THC, UA not consistent with UTI, HIV screen negative, acute hepatitis panel negative, B1 level low 24.6, TSH normal/1.94, folate low at 3.6, B12/1088, RPR nonreactive.  Suspected due to alcohol abuse, dehydration from poor oral intake complicating possible underlying dementia.  Discussed in detail with patient's daughter on 2/25, gradual decline in mental status over the last 3 months but abruptly in the few days preceding admission, mental status has improved but keeps repeating same questions that have been answered again and again.  She seems improved on 2/26.  Suspect delirium as well. 1. Continue thiamine, deficiency as noted above (will transition to high dose thiamine for Roque Schill few days) 2. Delirium precautions 3. Outpatient follow up with neurology 4. MRI without acute intracranial process (mod to severe parenchymal brain volume loss, severe white matter changes most compatible with chronic small vessel ischemic changes).   2. Anemia of chronic disease/folate deficiency: No bleeding reported.  Suspect anemia to be multifactorial related to bone marrow suppression from chronic alcohol abuse, folate deficiency versus others.  1. Fluctuating over  past few days.  Lower than at admission.  ? Dilution with IVF.  Continue to monitor.  2. Labs suggestive of AOCD with folate deficiency  3. Needs further outpatient follow up  4. Colonoscopy 2017 with tubular adenomas 5. FOBT negative  3. Alcohol dependence: Abstinence counseled.  Treated per CIWA protocol.  No overt withdrawal noted.  Continue thiamine, folate and multivitamins.  4. Tobacco abuse: Cessation counseled.  Nicotine patch.  5. Essential hypertension: Controlled.  Cozaar currently on hold, may consider resuming at discharge.  6. Hyperlipidemia: Statins held due to abnormal LFTs, may consider resuming at discharge.  7. GERD: Continue PPI.  8. Hyponatremia: Sodium 127 on admission.  Suspect multifactorial due to poor oral intake, associated dehydration, beer potomania.  Improved.  9. Hypokalemia/hypomagnesemia: Replace, follow.  10. Thrombocytopenia: Suspect due to alcohol abuse.  Continue to monitor.  Improving.  11. Abnormal LFTs: Likely 2/2 etoh use.  Acute hepatitis panel negative.  Slowly improving.  Alcohol abstinence.  Continue to monitor.  Improving.  Follow LFTs, INR.    12. Nonsevere (moderate) malnutrition in context of chronic illness: dietician c/s  DVT prophylaxis: SCD Code Status: full  Family Communication: daughter, [name] over phone Disposition Plan: pending SNF placement given need for 24 hour care   Consultants:   none  Procedures:   none  Antimicrobials:  Anti-infectives (From admission, onward)   None      Subjective: Confused, but Nell Gales&Ox3.  Keeps asking about going home, but when reminded we're planning for SNF, she's agreeable. Discussed with daughter over phone.  Objective: Vitals:   03/06/18 2039 03/07/18 0606 03/07/18 0801 03/07/18 1647  BP: 113/64 132/84 125/81 111/66  Pulse: 84 83  89 79  Resp: 20 19 18 20   Temp: 98.5 F (36.9 C) 98.6 F (37 C) 98.5 F (36.9 C) 98.1 F (36.7 C)  TempSrc: Oral Oral Oral Oral  SpO2:  100% 100% 100% 100%  Weight: 56.1 kg       Intake/Output Summary (Last 24 hours) at 03/07/2018 1802 Last data filed at 03/07/2018 1300 Gross per 24 hour  Intake 1020 ml  Output 600 ml  Net 420 ml   Filed Weights   03/03/18 1104 03/04/18 2000 03/06/18 2039  Weight: 56.8 kg 57.1 kg 56.1 kg    Examination:  General: No acute distress.  Sitting in nurses station due to confusion. Cardiovascular: Heart sounds show Delphine Sizemore regular rate, and rhythm.  Lungs: Clear to auscultation bilaterally with good air movement. Abdomen: Soft, nontender, nondistended  Neurological: Alert and oriented 3. Moves all extremities 4. Cranial nerves II through XII grossly intact. Skin: Warm and dry. No rashes or lesions. Extremities: No clubbing or cyanosis. No edema.   Data Reviewed: I have personally reviewed following labs and imaging studies  CBC: Recent Labs  Lab 02/28/18 2208  03/03/18 0401 03/04/18 0741 03/05/18 0351 03/06/18 0424 03/07/18 0413  WBC 8.4   < > 10.1 9.8 9.3 9.4 9.4  NEUTROABS 5.1  --   --   --   --   --   --   HGB 10.8*   < > 8.5* 8.2* 7.4* 7.5* 7.3*  HCT 30.3*   < > 24.5* 23.7* 21.5* 21.5* 21.2*  MCV 88.3   < > 92.1 90.8 90.0 89.2 90.6  PLT 141*   < > 124* 131* 123* 146* 156   < > = values in this interval not displayed.   Basic Metabolic Panel: Recent Labs  Lab 03/03/18 0401 03/04/18 0741 03/05/18 0351 03/06/18 0424 03/07/18 0413  NA 133* 131* 133* 133* 136  K 3.9 3.8 3.7 3.7 3.8  CL 107 103 104 105 108  CO2 21* 20* 23 21* 21*  GLUCOSE 96 101* 102* 91 84  BUN <5* <5* <5* <5* 5*  CREATININE 0.66 0.67 0.69 0.59 0.52  CALCIUM 8.6* 8.6* 8.4* 8.7* 9.0  MG 1.5* 1.7  --   --  1.5*   GFR: CrCl cannot be calculated (Unknown ideal weight.). Liver Function Tests: Recent Labs  Lab 03/03/18 0401 03/04/18 0741 03/05/18 0351 03/06/18 0424 03/07/18 0413  AST 72* 91* 70* 67* 62*  ALT 46* 52* 44 43 39  ALKPHOS 89 101 91 86 70  BILITOT 0.9 0.4 0.4 0.9 0.4  PROT 5.8*  5.6* 4.9* 5.3* 5.2*  ALBUMIN 2.6* 2.6* 2.3* 2.4* 2.4*   No results for input(s): LIPASE, AMYLASE in the last 168 hours. Recent Labs  Lab 02/28/18 2208  AMMONIA 26   Coagulation Profile: Recent Labs  Lab 03/06/18 0424  INR 1.1   Cardiac Enzymes: Recent Labs  Lab 02/28/18 2208  TROPONINI <0.03   BNP (last 3 results) No results for input(s): PROBNP in the last 8760 hours. HbA1C: No results for input(s): HGBA1C in the last 72 hours. CBG: Recent Labs  Lab 02/28/18 2221 03/01/18 0750  GLUCAP 87 70   Lipid Profile: No results for input(s): CHOL, HDL, LDLCALC, TRIG, CHOLHDL, LDLDIRECT in the last 72 hours. Thyroid Function Tests: No results for input(s): TSH, T4TOTAL, FREET4, T3FREE, THYROIDAB in the last 72 hours. Anemia Panel: No results for input(s): VITAMINB12, FOLATE, FERRITIN, TIBC, IRON, RETICCTPCT in the last 72 hours. Sepsis Labs: No results for input(s): PROCALCITON, LATICACIDVEN  in the last 168 hours.  No results found for this or any previous visit (from the past 240 hour(s)).       Radiology Studies: No results found.      Scheduled Meds: . feeding supplement (ENSURE ENLIVE)  237 mL Oral BID BM  . folic acid  1 mg Oral Daily  . multivitamin with minerals  1 tablet Oral Daily  . nicotine  21 mg Transdermal Daily  . pantoprazole  40 mg Oral Daily   Continuous Infusions: . sodium chloride Stopped (03/01/18 0138)  . thiamine injection       LOS: 4 days    Time spent: over 30 min    Fayrene Helper, MD [facility] Pager AMION  If 7PM-7AM, please contact night-coverage [email or web address] Password [facility] 03/07/2018, 6:02 PM

## 2018-03-07 NOTE — Progress Notes (Signed)
Nutrition Follow-up  DOCUMENTATION CODES:   Non-severe (moderate) malnutrition in context of chronic illness  INTERVENTION:   Magic cup TID with meals, each supplement provides 290 kcal and 9 grams of protein  Ensure Enlive po BID, each supplement provides 350 kcal and 20 grams of protein  Continue MVI with minerals  NUTRITION DIAGNOSIS:   Moderate Malnutrition related to chronic illness as evidenced by energy intake < 75% for > or equal to 3 months, mild fat depletion, mild muscle depletion.  Being addressed via supplements  GOAL:   Patient will meet greater than or equal to 90% of their needs  Progressing  MONITOR:   PO intake, Supplement acceptance, Labs, Weight trends  REASON FOR ASSESSMENT:   Consult Poor PO  ASSESSMENT:   63 yo female, admitted with acute metabolic encephalopathy. Presents with confusion. PMH significant for alcohol abuse, high cholesterol, HTN.  Remains confused at times, mental status improved overall   Recorded po intake 50-100% of meals, appetite good on Regular diet  Weight relatively stable since admission  Labs: reviewed Meds: MVI, thiamine, folic acid   Diet Order:   Diet Order            Diet regular Room service appropriate? Yes; Fluid consistency: Thin  Diet effective now              EDUCATION NEEDS:   No education needs have been identified at this time  Skin:  Skin Assessment: Skin Integrity Issues: Skin Integrity Issues:: Other (Comment) Other: MASD: labia, groin, sacrum, perineum, buttocks, anus, coccyx  Last BM:  2/23  Height:   Ht Readings from Last 1 Encounters:  12/27/15 5\' 3"  (1.6 m)    Weight:   Wt Readings from Last 1 Encounters:  03/06/18 56.1 kg    Ideal Body Weight:  52.3 kg  BMI:  Body mass index is 21.91 kg/m.  Estimated Nutritional Needs:   Kcal:  1450-1740 (25-30 kcal/kg)  Protein:  70-87 gm (1.2-1.5 g/kg)  Fluid:  1 mL/kcal or per MD   Kerman Passey MS, RD, LDN,  CNSC [phone] Pager  [phone] Weekend/On-Call Pager

## 2018-03-08 LAB — CBC
HCT: 21.8 % — ABNORMAL LOW (ref 36.0–46.0)
Hemoglobin: 7.3 g/dL — ABNORMAL LOW (ref 12.0–15.0)
MCH: 30.8 pg (ref 26.0–34.0)
MCHC: 33.5 g/dL (ref 30.0–36.0)
MCV: 92 fL (ref 80.0–100.0)
Platelets: 167 10*3/uL (ref 150–400)
RBC: 2.37 MIL/uL — ABNORMAL LOW (ref 3.87–5.11)
RDW: 14.3 % (ref 11.5–15.5)
WBC: 8.8 10*3/uL (ref 4.0–10.5)
nRBC: 0 % (ref 0.0–0.2)

## 2018-03-08 LAB — COMPREHENSIVE METABOLIC PANEL
ALT: 38 U/L (ref 0–44)
AST: 60 U/L — ABNORMAL HIGH (ref 15–41)
Albumin: 2.4 g/dL — ABNORMAL LOW (ref 3.5–5.0)
Alkaline Phosphatase: 85 U/L (ref 38–126)
Anion gap: 7 (ref 5–15)
BUN: 6 mg/dL — ABNORMAL LOW (ref 8–23)
CO2: 22 mmol/L (ref 22–32)
Calcium: 9 mg/dL (ref 8.9–10.3)
Chloride: 111 mmol/L (ref 98–111)
Creatinine, Ser: 0.6 mg/dL (ref 0.44–1.00)
GFR calc Af Amer: >60 mL/min (ref >60)
GFR calc non Af Amer: >60 mL/min (ref >60)
Glucose, Bld: 103 mg/dL — ABNORMAL HIGH (ref 70–99)
Potassium: 3.7 mmol/L (ref 3.5–5.1)
Sodium: 140 mmol/L (ref 135–145)
Total Bilirubin: 0.3 mg/dL (ref 0.3–1.2)
Total Protein: 5.3 g/dL — ABNORMAL LOW (ref 6.5–8.1)

## 2018-03-08 LAB — MAGNESIUM: Magnesium: 2.1 mg/dL (ref 1.7–2.4)

## 2018-03-08 MED ORDER — THIAMINE HCL 100 MG/ML IJ SOLN
500.0000 mg | Freq: Three times a day (TID) | INTRAVENOUS | Status: AC
  Administered 2018-03-08 – 2018-03-11 (×9): 500 mg via INTRAVENOUS
  Filled 2018-03-08 (×9): qty 5

## 2018-03-08 MED ORDER — VITAMIN B-1 100 MG PO TABS
100.0000 mg | ORAL_TABLET | Freq: Three times a day (TID) | ORAL | Status: DC
  Administered 2018-03-08: 100 mg via ORAL
  Filled 2018-03-08: qty 1

## 2018-03-08 NOTE — Clinical Social Work Note (Signed)
Clinical Social Work Assessment  Patient Details  Name: Tiffany Frye MRN: [id number] Date of Birth: 29-Apr-1955  Date of referral:  03/08/18               Reason for consult:  Facility Placement                Permission sought to share information with:  Facility Sport and exercise psychologist, Family Supports Permission granted to share information::  Yes, Verbal Permission Granted  Name::     Scientist, clinical (histocompatibility and immunogenetics)::  SNFs  Relationship::  Daughter  Contact Information:  [phone]  Housing/Transportation Living arrangements for the past 2 months:  [place] of Information:  Adult Children Patient Interpreter Needed:  None Criminal Activity/Legal Involvement Pertinent to Current Situation/Hospitalization:  No - Comment as needed Significant Relationships:  Adult Children Lives with:  Self Do you feel safe going back to the place where you live?  No Need for family participation in patient care:  Yes (Comment)  Care giving concerns:  CSW received consult for possible SNF placement at time of discharge. CSW spoke with patient's daughter, [name], regarding PT recommendation of SNF placement at time of discharge. Patient's daughter reported that she is currently unable to care for patient given patient's current physical needs and fall risk. Patient's daughter expressed understanding of PT recommendation and is agreeable to SNF placement at time of discharge. CSW to continue to follow and assist with discharge planning needs.   Social Worker assessment / plan:  CSW spoke with patient's daughter concerning possibility of rehab at [facility] before returning home.  Employment status:  Other (Comment)(Unsure) Insurance information:  Managed Care PT Recommendations:  [place] / Referral to community resources:  [place]  Patient/Family's Response to care:  Patient's daughter recognizes need for rehab before returning home and is agreeable to a SNF in  [place]. She reported preference for [facility] since patient's husband had been there before, though he has since passed away. CSW explained insurance authorization process and will provide bed offers when available.   Patient/Family's Understanding of and Emotional Response to Diagnosis, Current Treatment, and Prognosis:  Patient/family is realistic regarding therapy needs and expressed being hopeful for SNF placement. Patient's daughter expressed understanding of CSW role and discharge process as well as medical condition. No questions/concerns about plan or treatment.    Emotional Assessment Appearance:  Appears stated age Attitude/Demeanor/Rapport:  Unable to Assess Affect (typically observed):  Unable to Assess Orientation:  Oriented to Self, Oriented to Place Alcohol / Substance use:  Illicit Drugs Psych involvement (Current and /or in the community):  No (Comment)  Discharge Needs  Concerns to be addressed:  Care Coordination Readmission within the last 30 days:  No Current discharge risk:  Dependent with Mobility Barriers to Discharge:  Continued Medical Work up, [employer], LCSW 03/08/2018, 9:45 AM

## 2018-03-08 NOTE — Progress Notes (Signed)
PROGRESS NOTE    Tiffany Frye  WUG:[id number] DOB: 1955-03-30 DOA: 02/28/2018 PCP: Wenda Low, MD   Brief Narrative:  63 year old female, lives alone, independent, PMH of HTN, HLD, GERD, depression, alcohol and tobacco abuse, per family mental status reportedly declining over the past 3 to 4 months, presented to ED 2/22 with worsening confusion over the past 7 days PTA.  She was admitted due to acute metabolic encephalopathy.  Assessment & Plan:   Principal Problem:   Acute metabolic encephalopathy Active Problems:   Alcohol abuse   High cholesterol   Hypertension   Hyponatremia   Hyperkalemia   Malnutrition of moderate degree  1. Acute metabolic encephalopathy: Unclear etiology.  CT head without acute findings.  Ammonia: 26, UDS positive for THC, UA not consistent with UTI, HIV screen negative, acute hepatitis panel negative, B1 level low 24.6, TSH normal/1.94, folate low at 3.6, B12/1088, RPR nonreactive.  Suspected due to alcohol abuse, dehydration from poor oral intake complicating possible underlying dementia.  Discussed in detail with patient's daughter on 2/25, gradual decline in mental status over the last 3 months but abruptly in the few days preceding admission, mental status has improved but keeps repeating same questions that have been answered again and again.  She seems improved on 2/26.  Suspect delirium as well. 1. Continue thiamine, deficiency as noted above (will transition to high dose thiamine for Tiffany Frye few days) 2. Delirium precautions 3. Outpatient follow up with neurology 4. MRI without acute intracranial process (mod to severe parenchymal brain volume loss, severe white matter changes most compatible with chronic small vessel ischemic changes).   2. Anemia of chronic disease/folate deficiency: No bleeding reported.  Suspect anemia to be multifactorial related to bone marrow suppression from chronic alcohol abuse, folate deficiency versus others.  1. Fluctuating over  past few days.  Lower than at admission.  ? Dilution with IVF.  Continue to monitor.  2. Labs suggestive of AOCD with folate deficiency  3. Needs further outpatient follow up  4. Colonoscopy 2017 with tubular adenomas 5. FOBT negative  3. Alcohol dependence: Abstinence counseled.  Treated per CIWA protocol.  No overt withdrawal noted.  Continue thiamine, folate and multivitamins.  4. Tobacco abuse: Cessation counseled.  Nicotine patch.  5. Essential hypertension: Controlled.  Cozaar currently on hold, may consider resuming at discharge.  6. Hyperlipidemia: Statins held due to abnormal LFTs, may consider resuming at discharge.  7. GERD: Continue PPI.  8. Hyponatremia: Sodium 127 on admission.  Suspect multifactorial due to poor oral intake, associated dehydration, beer potomania.  Improved.  9. Hypokalemia/hypomagnesemia: Replace, follow.  10. Thrombocytopenia: Suspect due to alcohol abuse.  Continue to monitor.  Improving.  11. Abnormal LFTs: Likely 2/2 etoh use.  Acute hepatitis panel negative.  Slowly improving.  Alcohol abstinence.  Continue to monitor.  Improving.  Follow LFTs, INR.    12. Nonsevere (moderate) malnutrition in context of chronic illness: dietician c/s  DVT prophylaxis: SCD Code Status: full  Family Communication: daughter, Tiffany Frye over phone Disposition Plan: pending SNF placement given need for 24 hour care.  No consistent at home support for pt.  PT and OT recommending 24 hour supervision/assistance, which she does not have at home at this point in time.   Consultants:   none  Procedures:   none  Antimicrobials:  Anti-infectives (From admission, onward)   None      Subjective: Tiffany Frye&Ox3 today. No complaints.   Objective: Vitals:   03/07/18 2027 03/08/18 0551 03/08/18 0819 03/08/18 1428  BP: (!) 112/50 (!) 142/73 111/68 111/68  Pulse: 78 72 (!) 58 (!) 58  Resp: 20  20 20   Temp: 98.8 F (37.1 C) 98 F (36.7 C) 98.2 F (36.8 C) 98.2 F  (36.8 C)  TempSrc: Oral Oral Oral Oral  SpO2: 100% 100% 100%   Weight:    56.1 kg  Height:    5\' 5"  (1.651 m)    Intake/Output Summary (Last 24 hours) at 03/08/2018 1456 Last data filed at 03/08/2018 1228 Gross per 24 hour  Intake 643 ml  Output 300 ml  Net 343 ml   Filed Weights   03/04/18 2000 03/06/18 2039 03/08/18 1428  Weight: 57.1 kg 56.1 kg 56.1 kg    Examination:  General: No acute distress. Cardiovascular: Heart sounds show Tiffany Frye regular rate, and rhythm Lungs: Clear to auscultation bilaterally . Abdomen: Soft, nontender, nondistended  Neurological: Alert and oriented 3. Moves all extremities 4. Cranial nerves II through XII grossly intact. Skin: Warm and dry. No rashes or lesions. Extremities: No clubbing or cyanosis. No edema.    Data Reviewed: I have personally reviewed following labs and imaging studies  CBC: Recent Labs  Lab 03/04/18 0741 03/05/18 0351 03/06/18 0424 03/07/18 0413 03/08/18 0424  WBC 9.8 9.3 9.4 9.4 8.8  HGB 8.2* 7.4* 7.5* 7.3* 7.3*  HCT 23.7* 21.5* 21.5* 21.2* 21.8*  MCV 90.8 90.0 89.2 90.6 92.0  PLT 131* 123* 146* 156 242   Basic Metabolic Panel: Recent Labs  Lab 03/03/18 0401 03/04/18 0741 03/05/18 0351 03/06/18 0424 03/07/18 0413 03/08/18 0424  NA 133* 131* 133* 133* 136 140  K 3.9 3.8 3.7 3.7 3.8 3.7  CL 107 103 104 105 108 111  CO2 21* 20* 23 21* 21* 22  GLUCOSE 96 101* 102* 91 84 103*  BUN <5* <5* <5* <5* 5* 6*  CREATININE 0.66 0.67 0.69 0.59 0.52 0.60  CALCIUM 8.6* 8.6* 8.4* 8.7* 9.0 9.0  MG 1.5* 1.7  --   --  1.5* 2.1   GFR: Estimated Creatinine Clearance: 62.9 mL/min (by C-G formula based on SCr of 0.6 mg/dL). Liver Function Tests: Recent Labs  Lab 03/04/18 0741 03/05/18 0351 03/06/18 0424 03/07/18 0413 03/08/18 0424  AST 91* 70* 67* 62* 60*  ALT 52* 44 43 39 38  ALKPHOS 101 91 86 70 85  BILITOT 0.4 0.4 0.9 0.4 0.3  PROT 5.6* 4.9* 5.3* 5.2* 5.3*  ALBUMIN 2.6* 2.3* 2.4* 2.4* 2.4*   No results for  input(s): LIPASE, AMYLASE in the last 168 hours. No results for input(s): AMMONIA in the last 168 hours. Coagulation Profile: Recent Labs  Lab 03/06/18 0424  INR 1.1   Cardiac Enzymes: No results for input(s): CKTOTAL, CKMB, CKMBINDEX, TROPONINI in the last 168 hours. BNP (last 3 results) No results for input(s): PROBNP in the last 8760 hours. HbA1C: No results for input(s): HGBA1C in the last 72 hours. CBG: No results for input(s): GLUCAP in the last 168 hours. Lipid Profile: No results for input(s): CHOL, HDL, LDLCALC, TRIG, CHOLHDL, LDLDIRECT in the last 72 hours. Thyroid Function Tests: No results for input(s): TSH, T4TOTAL, FREET4, T3FREE, THYROIDAB in the last 72 hours. Anemia Panel: No results for input(s): VITAMINB12, FOLATE, FERRITIN, TIBC, IRON, RETICCTPCT in the last 72 hours. Sepsis Labs: No results for input(s): PROCALCITON, LATICACIDVEN in the last 168 hours.  No results found for this or any previous visit (from the past 240 hour(s)).       Radiology Studies: No results found.  Scheduled Meds: . feeding supplement (ENSURE ENLIVE)  237 mL Oral BID BM  . folic acid  1 mg Oral Daily  . multivitamin with minerals  1 tablet Oral Daily  . nicotine  21 mg Transdermal Daily  . pantoprazole  40 mg Oral Daily  . thiamine  100 mg Oral TID   Continuous Infusions: . sodium chloride Stopped (03/01/18 0138)     LOS: 5 days    Time spent: over 81 min    Fayrene Helper, MD [facility] Pager AMION  If 7PM-7AM, please contact night-coverage [email or web address] Password [facility] 03/08/2018, 2:56 PM

## 2018-03-09 LAB — COMPREHENSIVE METABOLIC PANEL
ALT: 37 U/L (ref 0–44)
AST: 60 U/L — ABNORMAL HIGH (ref 15–41)
Albumin: 2.4 g/dL — ABNORMAL LOW (ref 3.5–5.0)
Alkaline Phosphatase: 94 U/L (ref 38–126)
Anion gap: 5 (ref 5–15)
BUN: 5 mg/dL — ABNORMAL LOW (ref 8–23)
CO2: 23 mmol/L (ref 22–32)
Calcium: 8.8 mg/dL — ABNORMAL LOW (ref 8.9–10.3)
Chloride: 110 mmol/L (ref 98–111)
Creatinine, Ser: 0.77 mg/dL (ref 0.44–1.00)
GFR calc Af Amer: >60 mL/min (ref >60)
GFR calc non Af Amer: >60 mL/min (ref >60)
Glucose, Bld: 98 mg/dL (ref 70–99)
Potassium: 3.8 mmol/L (ref 3.5–5.1)
Sodium: 138 mmol/L (ref 135–145)
Total Bilirubin: 0.5 mg/dL (ref 0.3–1.2)
Total Protein: 5.5 g/dL — ABNORMAL LOW (ref 6.5–8.1)

## 2018-03-09 LAB — CBC
HCT: 22.5 % — ABNORMAL LOW (ref 36.0–46.0)
Hemoglobin: 7.6 g/dL — ABNORMAL LOW (ref 12.0–15.0)
MCH: 31.1 pg (ref 26.0–34.0)
MCHC: 33.8 g/dL (ref 30.0–36.0)
MCV: 92.2 fL (ref 80.0–100.0)
Platelets: 173 10*3/uL (ref 150–400)
RBC: 2.44 MIL/uL — ABNORMAL LOW (ref 3.87–5.11)
RDW: 14.4 % (ref 11.5–15.5)
WBC: 8.8 10*3/uL (ref 4.0–10.5)
nRBC: 0 % (ref 0.0–0.2)

## 2018-03-09 NOTE — Progress Notes (Signed)
PROGRESS NOTE    Tiffany Frye  ERX:[id number] DOB: 08/28/1955 DOA: 02/28/2018 PCP: Tiffany Low, MD   Brief Narrative:  63 year old female, lives alone, independent, PMH of HTN, HLD, GERD, depression, alcohol and tobacco abuse, per family mental status reportedly declining over the past 3 to 4 months, presented to ED 2/22 with worsening confusion over the past 7 days PTA.  She was admitted due to acute metabolic encephalopathy.  Assessment & Plan:   Principal Problem:   Acute metabolic encephalopathy Active Problems:   Alcohol abuse   High cholesterol   Hypertension   Hyponatremia   Hyperkalemia   Malnutrition of moderate degree  1. Acute metabolic encephalopathy: Unclear etiology.  CT head without acute findings.  Ammonia: 26, UDS positive for THC, UA not consistent with UTI, HIV screen negative, acute hepatitis panel negative, B1 level Frye 24.6, TSH normal/1.94, folate Frye at 3.6, B12/1088, RPR nonreactive.  Suspected due to alcohol abuse, dehydration from poor oral intake complicating possible underlying dementia.  Discussed in detail with patient's daughter on 2/25, gradual decline in mental status over the last 3 months but abruptly in the few days preceding admission, mental status has improved but keeps repeating same questions that have been answered again and again.  She seems improved on 2/26.  Suspect delirium as well. 1. Continue thiamine, deficiency as noted above (will transition to high dose thiamine for Tiffany Frye few days) 2. Delirium precautions 3. Outpatient follow up with neurology 4. MRI without acute intracranial process (mod to severe parenchymal brain volume loss, severe white matter changes most compatible with chronic small vessel ischemic changes).   2. Anemia of chronic disease/folate deficiency: No bleeding reported.  Suspect anemia to be multifactorial related to bone marrow suppression from chronic alcohol abuse, folate deficiency versus others.  1. Fluctuating over  past few days.  Lower than at admission.  ? Dilution with IVF.  Stable. 2. Labs suggestive of AOCD with folate deficiency  3. Needs further outpatient follow up  4. Colonoscopy 2017 with tubular adenomas 5. FOBT negative  3. Alcohol dependence: Abstinence counseled.  Treated per CIWA protocol.  No overt withdrawal noted.  Continue thiamine, folate and multivitamins.  4. Tobacco abuse: Cessation counseled.  Nicotine patch.  5. Essential hypertension: Stable.  Cozaar currently on hold, may consider resuming at discharge.  6. Hyperlipidemia: Statins held due to abnormal LFTs, may consider resuming at discharge.  7. GERD: Continue PPI.  8. Hyponatremia: Sodium 127 on admission.  Suspect multifactorial due to poor oral intake, associated dehydration, beer potomania.  Improved.  9. Hypokalemia/hypomagnesemia: Improved.  10. Thrombocytopenia: Suspect due to alcohol abuse.  Continue to monitor.  Imrpoved..  11. Abnormal LFTs: Likely 2/2 etoh use.  Acute hepatitis panel negative.  Slowly improving.  Alcohol abstinence.  Continue to monitor.  Improving.  Follow LFTs, INR.    12. Nonsevere (moderate) malnutrition in context of chronic illness: dietician c/s  DVT prophylaxis: SCD Code Status: full  Family Communication: daughter, Tiffany Frye over phone Disposition Plan: pending SNF placement given need for 24 hour care.  No consistent at home support for pt.  PT and OT recommending 24 hour supervision/assistance, which she does not have at home at this point in time.   Consultants:   none  Procedures:   none  Antimicrobials:  Anti-infectives (From admission, onward)   None      Subjective: Tiffany Frye (said February, but it's beginning of march, corrrectable). No other complaints. Spoke with daughter Tiffany Frye over phone  Objective: Vitals:  03/08/18 1648 03/08/18 2050 03/09/18 0457 03/09/18 0510  BP: 120/67 (!) 108/57  112/68  Pulse: 73 81  78  Resp:  19  18  Temp: 98.2 F  (36.8 C) 98.3 F (36.8 C)  98 F (36.7 C)  TempSrc: Oral Oral  Oral  SpO2: 100% 100%  99%  Weight:  56.1 kg 56.1 kg   Height:        Intake/Output Summary (Last 24 hours) at 03/09/2018 1449 Last data filed at 03/09/2018 1051 Gross per 24 hour  Intake 621.39 ml  Output 900 ml  Net -278.61 ml   Filed Weights   03/08/18 1428 03/08/18 2050 03/09/18 0457  Weight: 56.1 kg 56.1 kg 56.1 kg    Examination:  General: No acute distress. Cardiovascular: Heart sounds show Tiffany Feltus regular rate, and rhythm. N Lungs: Clear to auscultation bilaterally  Abdomen: Soft, nontender, nondistended  Neurological: Alert and oriented 3. Moves all extremities 4. Cranial nerves II through XII grossly intact. Skin: Warm and dry. No rashes or lesions. Extremities: No clubbing or cyanosis. No edema.  Psychiatric: Mood and affect are normal. Insight and judgment are impaired.    Data Reviewed: I have personally reviewed following labs and imaging studies  CBC: Recent Labs  Lab 03/05/18 0351 03/06/18 0424 03/07/18 0413 03/08/18 0424 03/09/18 0624  WBC 9.3 9.4 9.4 8.8 8.8  HGB 7.4* 7.5* 7.3* 7.3* 7.6*  HCT 21.5* 21.5* 21.2* 21.8* 22.5*  MCV 90.0 89.2 90.6 92.0 92.2  PLT 123* 146* 156 167 761   Basic Metabolic Panel: Recent Labs  Lab 03/03/18 0401 03/04/18 0741 03/05/18 0351 03/06/18 0424 03/07/18 0413 03/08/18 0424 03/09/18 0624  NA 133* 131* 133* 133* 136 140 138  K 3.9 3.8 3.7 3.7 3.8 3.7 3.8  CL 107 103 104 105 108 111 110  CO2 21* 20* 23 21* 21* 22 23  GLUCOSE 96 101* 102* 91 84 103* 98  BUN <5* <5* <5* <5* 5* 6* 5*  CREATININE 0.66 0.67 0.69 0.59 0.52 0.60 0.77  CALCIUM 8.6* 8.6* 8.4* 8.7* 9.0 9.0 8.8*  MG 1.5* 1.7  --   --  1.5* 2.1  --    GFR: Estimated Creatinine Clearance: 62.9 mL/min (by C-G formula based on SCr of 0.77 mg/dL). Liver Function Tests: Recent Labs  Lab 03/05/18 0351 03/06/18 0424 03/07/18 0413 03/08/18 0424 03/09/18 0624  AST 70* 67* 62* 60* 60*  ALT 44  43 39 38 37  ALKPHOS 91 86 70 85 94  BILITOT 0.4 0.9 0.4 0.3 0.5  PROT 4.9* 5.3* 5.2* 5.3* 5.5*  ALBUMIN 2.3* 2.4* 2.4* 2.4* 2.4*   No results for input(s): LIPASE, AMYLASE in the last 168 hours. No results for input(s): AMMONIA in the last 168 hours. Coagulation Profile: Recent Labs  Lab 03/06/18 0424  INR 1.1   Cardiac Enzymes: No results for input(s): CKTOTAL, CKMB, CKMBINDEX, TROPONINI in the last 168 hours. BNP (last 3 results) No results for input(s): PROBNP in the last 8760 hours. HbA1C: No results for input(s): HGBA1C in the last 72 hours. CBG: No results for input(s): GLUCAP in the last 168 hours. Lipid Profile: No results for input(s): CHOL, HDL, LDLCALC, TRIG, CHOLHDL, LDLDIRECT in the last 72 hours. Thyroid Function Tests: No results for input(s): TSH, T4TOTAL, FREET4, T3FREE, THYROIDAB in the last 72 hours. Anemia Panel: No results for input(s): VITAMINB12, FOLATE, FERRITIN, TIBC, IRON, RETICCTPCT in the last 72 hours. Sepsis Labs: No results for input(s): PROCALCITON, LATICACIDVEN in the last 168 hours.  No  results found for this or any previous visit (from the past 240 hour(s)).       Radiology Studies: No results found.      Scheduled Meds: . feeding supplement (ENSURE ENLIVE)  237 mL Oral BID BM  . folic acid  1 mg Oral Daily  . multivitamin with minerals  1 tablet Oral Daily  . nicotine  21 mg Transdermal Daily  . pantoprazole  40 mg Oral Daily   Continuous Infusions: . sodium chloride Stopped (03/01/18 0138)  . thiamine injection 500 mg (03/09/18 0955)     LOS: 6 days    Time spent: over 30 min    Fayrene Helper, MD [facility] Pager AMION  If 7PM-7AM, please contact night-coverage [email or web address] Password [facility] 03/09/2018, 2:49 PM

## 2018-03-10 NOTE — Progress Notes (Addendum)
PROGRESS NOTE    Tiffany Frye  YTK:[id number] DOB: 29-Jun-1955 DOA: 02/28/2018 PCP: Wenda Low, MD   Brief Narrative:  63 year old female, lives alone, independent, PMH of HTN, HLD, GERD, depression, alcohol and tobacco abuse, per family mental status reportedly declining over the past 3 to 4 months, presented to ED 2/22 with worsening confusion over the past 7 days PTA.  She was admitted due to acute metabolic encephalopathy.  Assessment & Plan:   Principal Problem:   Acute metabolic encephalopathy Active Problems:   Alcohol abuse   High cholesterol   Hypertension   Hyponatremia   Hyperkalemia   Malnutrition of moderate degree  1. Acute metabolic encephalopathy: Unclear etiology.  CT head without acute findings.  Ammonia: 26, UDS positive for THC, UA not consistent with UTI, HIV screen negative, acute hepatitis panel negative, B1 level low 24.6, TSH normal/1.94, folate low at 3.6, B12/1088, RPR nonreactive.  Suspected due to alcohol abuse, dehydration from poor oral intake complicating possible underlying dementia.  Discussed in detail with patient's daughter on 2/25, gradual decline in mental status over the last 3 months but abruptly in the few days preceding admission, mental status has improved but keeps repeating same questions that have been answered again and again.  She seems improved on 2/26.  Suspect delirium as well. 1. Continue thiamine, deficiency as noted above (will transition to high dose thiamine for Litzi Binning few days) 2. Delirium precautions 3. Outpatient follow up with neurology 4. MRI without acute intracranial process (mod to severe parenchymal brain volume loss, severe white matter changes most compatible with chronic small vessel ischemic changes).   2. Anemia of chronic disease/folate deficiency: No bleeding reported.  Suspect anemia to be multifactorial related to bone marrow suppression from chronic alcohol abuse, folate deficiency versus others.  1. Fluctuating over  past few days.  Lower than at admission.  ? Dilution with IVF.  Stable. 2. Labs suggestive of AOCD with folate deficiency  3. Needs further outpatient follow up  4. Colonoscopy 2017 with tubular adenomas 5. FOBT negative  3. Alcohol dependence: Abstinence counseled.  Treated per CIWA protocol.  No overt withdrawal noted.  Continue thiamine, folate and multivitamins.  4. Tobacco abuse: Cessation counseled.  Nicotine patch.  5. Essential hypertension: Stable.  Cozaar currently on hold, may consider resuming at discharge.  6. Hyperlipidemia: Statins held due to abnormal LFTs, may consider resuming at discharge.  7. GERD: Continue PPI.  8. Hyponatremia: Sodium 127 on admission.  Suspect multifactorial due to poor oral intake, associated dehydration, beer potomania.  Improved.  9. Hypokalemia/hypomagnesemia: Improved.  10. Thrombocytopenia: Suspect due to alcohol abuse.  Continue to monitor.  Imrpoved..  11. Abnormal LFTs: Likely 2/2 etoh use.  Acute hepatitis panel negative.  Slowly improving.  Alcohol abstinence.  Continue to monitor.  Improving.  Follow LFTs, INR.    12. Nonsevere (moderate) malnutrition in context of chronic illness: dietician c/s  DVT prophylaxis: SCD Code Status: full  Family Communication: nephew at bedside Disposition Plan: pending SNF placement given need for 24 hour care.  No consistent at home support for pt.  PT and OT recommending 24 hour supervision/assistance, which she does not have at home at this point in time.   Consultants:   none  Procedures:   none  Antimicrobials:  Anti-infectives (From admission, onward)   None      Subjective: No complaints today.  Objective: Vitals:   03/10/18 0500 03/10/18 0630 03/10/18 1047 03/10/18 1556  BP:  120/68 (!) 148/73 113/65  Pulse:  80 84 79  Resp:  18 20 18   Temp:  98.7 F (37.1 C) 98.6 F (37 C) 98.7 F (37.1 C)  TempSrc:  Oral Oral Oral  SpO2:  98% 100% 100%  Weight: 56.1 kg       Height:        Intake/Output Summary (Last 24 hours) at 03/10/2018 1713 Last data filed at 03/10/2018 1613 Gross per 24 hour  Intake 730 ml  Output 0 ml  Net 730 ml   Filed Weights   03/08/18 2050 03/09/18 0457 03/10/18 0500  Weight: 56.1 kg 56.1 kg 56.1 kg    Examination:  General: No acute distress. Cardiovascular: Heart sounds show Janisse Ghan regular rate, and rhythm. . Lungs: Clear to auscultation bilaterally  Abdomen: Soft, nontender, nondistended Neurological: Alert and oriented. Moves all extremities 4. Cranial nerves II through XII grossly intact. Skin: Warm and dry. No rashes or lesions. Extremities: No clubbing or cyanosis. No edema.  Psychiatric: Mood and affect are normal. Insight and judgment are impaired.     Data Reviewed: I have personally reviewed following labs and imaging studies  CBC: Recent Labs  Lab 03/05/18 0351 03/06/18 0424 03/07/18 0413 03/08/18 0424 03/09/18 0624  WBC 9.3 9.4 9.4 8.8 8.8  HGB 7.4* 7.5* 7.3* 7.3* 7.6*  HCT 21.5* 21.5* 21.2* 21.8* 22.5*  MCV 90.0 89.2 90.6 92.0 92.2  PLT 123* 146* 156 167 161   Basic Metabolic Panel: Recent Labs  Lab 03/04/18 0741 03/05/18 0351 03/06/18 0424 03/07/18 0413 03/08/18 0424 03/09/18 0624  NA 131* 133* 133* 136 140 138  K 3.8 3.7 3.7 3.8 3.7 3.8  CL 103 104 105 108 111 110  CO2 20* 23 21* 21* 22 23  GLUCOSE 101* 102* 91 84 103* 98  BUN <5* <5* <5* 5* 6* 5*  CREATININE 0.67 0.69 0.59 0.52 0.60 0.77  CALCIUM 8.6* 8.4* 8.7* 9.0 9.0 8.8*  MG 1.7  --   --  1.5* 2.1  --    GFR: Estimated Creatinine Clearance: 62.9 mL/min (by C-G formula based on SCr of 0.77 mg/dL). Liver Function Tests: Recent Labs  Lab 03/05/18 0351 03/06/18 0424 03/07/18 0413 03/08/18 0424 03/09/18 0624  AST 70* 67* 62* 60* 60*  ALT 44 43 39 38 37  ALKPHOS 91 86 70 85 94  BILITOT 0.4 0.9 0.4 0.3 0.5  PROT 4.9* 5.3* 5.2* 5.3* 5.5*  ALBUMIN 2.3* 2.4* 2.4* 2.4* 2.4*   No results for input(s): LIPASE, AMYLASE in the  last 168 hours. No results for input(s): AMMONIA in the last 168 hours. Coagulation Profile: Recent Labs  Lab 03/06/18 0424  INR 1.1   Cardiac Enzymes: No results for input(s): CKTOTAL, CKMB, CKMBINDEX, TROPONINI in the last 168 hours. BNP (last 3 results) No results for input(s): PROBNP in the last 8760 hours. HbA1C: No results for input(s): HGBA1C in the last 72 hours. CBG: No results for input(s): GLUCAP in the last 168 hours. Lipid Profile: No results for input(s): CHOL, HDL, LDLCALC, TRIG, CHOLHDL, LDLDIRECT in the last 72 hours. Thyroid Function Tests: No results for input(s): TSH, T4TOTAL, FREET4, T3FREE, THYROIDAB in the last 72 hours. Anemia Panel: No results for input(s): VITAMINB12, FOLATE, FERRITIN, TIBC, IRON, RETICCTPCT in the last 72 hours. Sepsis Labs: No results for input(s): PROCALCITON, LATICACIDVEN in the last 168 hours.  No results found for this or any previous visit (from the past 240 hour(s)).       Radiology Studies: No results found.  Scheduled Meds: . feeding supplement (ENSURE ENLIVE)  237 mL Oral BID BM  . folic acid  1 mg Oral Daily  . multivitamin with minerals  1 tablet Oral Daily  . nicotine  21 mg Transdermal Daily  . pantoprazole  40 mg Oral Daily   Continuous Infusions: . sodium chloride Stopped (03/01/18 0138)  . thiamine injection 500 mg (03/10/18 1613)     LOS: 7 days    Time spent: over 76 min    Fayrene Helper, MD [facility] Pager AMION  If 7PM-7AM, please contact night-coverage [email or web address] Password [facility] 03/10/2018, 5:13 PM

## 2018-03-10 NOTE — Progress Notes (Signed)
Physical Therapy Treatment Patient Details Name: Tiffany Frye MRN: [id number] DOB: 1955/02/21 Today's Date: 03/10/2018    History of Present Illness Pt is a 62 y/o female with a PMH significant for HTN, ETOH abuse. She presents with AMS, and daughter states that mental status has been declining for months and acutely worsened in the past week. AMS felt to be related to heavy alcohol consumption and malnutrition. CT Head and CXR both negative for acute changes.    PT Comments    Pt progressing towards physical therapy goals. There has been a lot of variation between recommendations for home health vs. SNF. Per chart review pt will not have 24 hour assistance at home, and plan is now for SNF and pt agreeable even though pt was refusing at last session. PT recommendations updated to SNF to maximize functional independence and safety prior to return home with family. Will continue to follow.    Follow Up Recommendations  SNF;Supervision/Assistance - 24 hour     Equipment Recommendations  Rolling walker with 5" wheels    Recommendations for Other Services       Precautions / Restrictions Precautions Precautions: Fall Restrictions Weight Bearing Restrictions: No    Mobility  Bed Mobility Overal bed mobility: Modified Independent Bed Mobility: Supine to Sit;Sit to Supine           General bed mobility comments: No difficulty to return to supine  Transfers Overall transfer level: Needs assistance Equipment used: Rolling walker (2 wheeled);None Transfers: Sit to/from Stand Sit to Stand: Supervision;Min assist         General transfer comment: From low toilet, required min assist to power-up to full standing position. From EOB, pt was able to power-up with supervision for safety. Initially used walker from toilet but was able to complete without AD from EOB.   Ambulation/Gait Ambulation/Gait assistance: Min guard Gait Distance (Feet): 300 Feet(Hallway ambulation and ambulation  in room) Assistive device: Rolling walker (2 wheeled) Gait Pattern/deviations: Step-through pattern;Decreased stride length;Trunk flexed Gait velocity: Decreased Gait velocity interpretation: 1.31 - 2.62 ft/sec, indicative of limited community ambulator General Gait Details: Initially with RW in room. Pt was able to progress to no AD and did not require assistance in the hallway, however hands on guarding provided for safety.    Stairs             Wheelchair Mobility    Modified Rankin (Stroke Patients Only)       Balance Overall balance assessment: Needs assistance Sitting-balance support: Feet supported;No upper extremity supported Sitting balance-Leahy Scale: Fair     Standing balance support: No upper extremity supported;During functional activity Standing balance-Leahy Scale: Fair Standing balance comment: Able to stand at sink and wash hands/wip bottom without UE support.                  Standardized Balance Assessment Standardized Balance Assessment : Dynamic Gait Index   Dynamic Gait Index Level Surface: Mild Impairment Change in Gait Speed: Moderate Impairment Gait with Horizontal Head Turns: Moderate Impairment Gait with Vertical Head Turns: Moderate Impairment Gait and Pivot Turn: Mild Impairment Step Over Obstacle: Moderate Impairment Step Around Obstacles: Mild Impairment      Cognition Arousal/Alertness: Awake/alert Behavior During Therapy: Impulsive Overall Cognitive Status: Impaired/Different from baseline Area of Impairment: Following commands;Safety/judgement;Awareness;Problem solving;Memory;Orientation                 Orientation Level: Disoriented to;Place;Time;Situation   Memory: Decreased short-term memory Following Commands: Follows one step commands consistently;Follows one  step commands with increased time;Follows multi-step commands inconsistently Safety/Judgement: Decreased awareness of safety;Decreased awareness of  deficits Awareness: Intellectual Problem Solving: Slow processing;Requires verbal cues General Comments: Appears slightly improved today however still not oriented (gave year as 2015, and states she is waititng for her daughter to come pick her up so they can go do something and then her daughter will bring her back later).       Exercises      General Comments        Pertinent Vitals/Pain Pain Assessment: No/denies pain    Home Living                      Prior Function            PT Goals (current goals can now be found in the care plan section) Acute Rehab PT Goals Patient Stated Goal: Have her daughter come pick her up  PT Goal Formulation: With patient Time For Goal Achievement: 03/17/18 Potential to Achieve Goals: Good Progress towards PT goals: Progressing toward goals    Frequency    Min 2X/week      PT Plan Discharge plan needs to be updated;Frequency needs to be updated    Co-evaluation              AM-PAC PT "6 Clicks" Mobility   Outcome Measure  Help needed turning from your back to your side while in a flat bed without using bedrails?: None Help needed moving from lying on your back to sitting on the side of a flat bed without using bedrails?: None Help needed moving to and from a bed to a chair (including a wheelchair)?: None Help needed standing up from a chair using your arms (e.g., wheelchair or bedside chair)?: None Help needed to walk in hospital room?: None Help needed climbing 3-5 steps with a railing? : A Little 6 Click Score: 23    End of Session Equipment Utilized During Treatment: Gait belt Activity Tolerance: Patient tolerated treatment well Patient left: in bed;with call bell/phone within reach;with bed alarm set Nurse Communication: Mobility status PT Visit Diagnosis: Unsteadiness on feet (R26.81);Muscle weakness (generalized) (M62.81);Difficulty in walking, not elsewhere classified (R26.2)     Time:  6433-2951 PT Time Calculation (min) (ACUTE ONLY): 25 min  Charges:  $Gait Training: 23-37 mins                     Rolinda Roan, PT, DPT Acute Rehabilitation Services Pager: [phone] Office: [phone]    Thelma Comp 03/10/2018, 9:11 AM

## 2018-03-10 NOTE — Clinical Social Work Placement (Addendum)
   CLINICAL SOCIAL WORK PLACEMENT  NOTE *03/10/18 - [place] ([employer] OTHER) INITIATED BY  FACILITY.  Date:  03/10/2018  Patient Details  Name: Jewelle Whitner MRN: [id number] Date of Birth: 1955-08-06  Clinical Social Work is seeking post-discharge placement for this patient at the [place] level of care (*CSW will initial, date and re-position this form in  chart as items are completed):  No   Patient/family provided with [facility] Work Department's list of facilities offering this level of care within the geographic area requested by the patient (or if unable, by the patient's family).  Yes   Patient/family informed of their freedom to choose among providers that offer the needed level of care, that participate in Medicare, Medicaid or managed care program needed by the patient, have an available bed and are willing to accept the patient.  No   Patient/family informed of [facility]'s ownership interest in [facility] and [facility], as well as of the fact that they are under no obligation to receive care at these facilities.  PASRR submitted to EDS on 03/04/18     PASRR number received on 03/04/18     Existing PASRR number confirmed on       FL2 transmitted to all facilities in geographic area requested by pt/family on 03/08/18     FL2 transmitted to all facilities within larger geographic area on       Patient informed that his/her managed care company has contracts with or will negotiate with certain facilities, including the following:        Yes   Patient/family informed of bed offers received.  Patient chooses bed at [facility]     Physician recommends and patient chooses bed at      Patient to be transferred to [facility] on  .  Patient to be transferred to facility by       Patient family notified on   of transfer.  Name of family member notified:        PHYSICIAN       Additional  Comment:    _______________________________________________ Sable Feil, LCSW 03/10/2018, 5:09 PM

## 2018-03-11 LAB — BASIC METABOLIC PANEL
Anion gap: 5 (ref 5–15)
BUN: 8 mg/dL (ref 8–23)
CO2: 23 mmol/L (ref 22–32)
Calcium: 8.7 mg/dL — ABNORMAL LOW (ref 8.9–10.3)
Chloride: 113 mmol/L — ABNORMAL HIGH (ref 98–111)
Creatinine, Ser: 0.69 mg/dL (ref 0.44–1.00)
GFR calc Af Amer: >60 mL/min (ref >60)
GFR calc non Af Amer: >60 mL/min (ref >60)
Glucose, Bld: 88 mg/dL (ref 70–99)
Potassium: 3.5 mmol/L (ref 3.5–5.1)
Sodium: 141 mmol/L (ref 135–145)

## 2018-03-11 LAB — CBC
HCT: 21.2 % — ABNORMAL LOW (ref 36.0–46.0)
Hemoglobin: 7.3 g/dL — ABNORMAL LOW (ref 12.0–15.0)
MCH: 32.3 pg (ref 26.0–34.0)
MCHC: 34.4 g/dL (ref 30.0–36.0)
MCV: 93.8 fL (ref 80.0–100.0)
Platelets: 170 10*3/uL (ref 150–400)
RBC: 2.26 MIL/uL — ABNORMAL LOW (ref 3.87–5.11)
RDW: 14.9 % (ref 11.5–15.5)
WBC: 8.7 10*3/uL (ref 4.0–10.5)
nRBC: 0 % (ref 0.0–0.2)

## 2018-03-11 MED ORDER — VITAMIN B-1 100 MG PO TABS: 100.0000 mg | ORAL_TABLET | Freq: Every day | ORAL | Status: DC

## 2018-03-11 MED ORDER — VITAMIN B-1 100 MG PO TABS
100.0000 mg | ORAL_TABLET | Freq: Three times a day (TID) | ORAL | Status: DC
  Administered 2018-03-12 – 2018-03-13 (×4): 100 mg via ORAL
  Filled 2018-03-11 (×4): qty 1

## 2018-03-11 NOTE — Progress Notes (Signed)
PROGRESS NOTE    Tiffany Frye  DXA:[id number] DOB: 06-24-1955 DOA: 02/28/2018 PCP: Wenda Low, MD   Brief Narrative:  63 year old female, lives alone, independent, PMH of HTN, HLD, GERD, depression, alcohol and tobacco abuse, per family mental status reportedly declining over the past 3 to 4 months, presented to ED 2/22 with worsening confusion over the past 7 days PTA.  She was admitted due to acute metabolic encephalopathy.  Assessment & Plan:   Principal Problem:   Acute metabolic encephalopathy Active Problems:   Alcohol abuse   High cholesterol   Hypertension   Hyponatremia   Hyperkalemia   Malnutrition of moderate degree  1. Acute metabolic encephalopathy: Unclear etiology.  CT head without acute findings.  Ammonia: 26, UDS positive for THC, UA not consistent with UTI, HIV screen negative, acute hepatitis panel negative, B1 level low 24.6, TSH normal/1.94, folate low at 3.6, B12/1088, RPR nonreactive.  Suspected due to alcohol abuse, dehydration from poor oral intake complicating possible underlying dementia.  Discussed in detail with patient's daughter on 2/25, gradual decline in mental status over the last 3 months but abruptly in the few days preceding admission, mental status has improved but keeps repeating same questions that have been answered again and again.  She seems improved on 2/26.  Suspect delirium as well. 1. Continue thiamine, deficiency as noted above (s/p 500 mg TID, will plan to use 100 mg TID for 1-2 weeks then 100 mg daily) 2. Delirium precautions 3. Outpatient follow up with neurology 4. MRI without acute intracranial process (mod to severe parenchymal brain volume loss, severe white matter changes most compatible with chronic small vessel ischemic changes).   2. Anemia of chronic disease/folate deficiency: No bleeding reported.  Suspect anemia to be multifactorial related to bone marrow suppression from chronic alcohol abuse, folate deficiency versus others.    1. Fluctuating over past few days.  Lower than at admission.  ? Dilution with IVF.  Stable. 2. Labs suggestive of AOCD with folate deficiency  3. Needs further outpatient follow up  4. Colonoscopy 2017 with tubular adenomas 5. FOBT negative  3. Alcohol dependence: Abstinence counseled.  Treated per CIWA protocol.  No overt withdrawal noted.  Continue thiamine, folate and multivitamins.  4. Tobacco abuse: Cessation counseled.  Nicotine patch.  5. Essential hypertension: Stable.  Cozaar currently on hold, may consider resuming at discharge.  6. Hyperlipidemia: Statins held due to abnormal LFTs, may consider resuming at discharge.  7. GERD: Continue PPI.  8. Hyponatremia: Sodium 127 on admission.  Suspect multifactorial due to poor oral intake, associated dehydration, beer potomania.  Improved.  9. Hypokalemia/hypomagnesemia: Improved.  10. Thrombocytopenia: Suspect due to alcohol abuse.  Continue to monitor.  Imrpoved..  11. Abnormal LFTs: Likely 2/2 etoh use.  Acute hepatitis panel negative.  Slowly improving.  Alcohol abstinence.  Continue to monitor.  Improving.  Follow LFTs, INR.    12. Nonsevere (moderate) malnutrition in context of chronic illness: dietician c/s  DVT prophylaxis: SCD Code Status: full  Family Communication: daughter at bedside Disposition Plan: pending SNF placement given need for 24 hour care.  No consistent at home support for pt.  PT and OT recommending 24 hour supervision/assistance, which she does not have at home at this point in time.   Consultants:   none  Procedures:   none  Antimicrobials:  Anti-infectives (From admission, onward)   None      Subjective: No complaints. Daughter at bedside  Objective: Vitals:   03/10/18 1959 03/11/18 0505 03/11/18  5400 03/11/18 1615  BP: 123/69 137/65 130/72 120/65  Pulse: 86 85  80  Resp: 16 18 18 18   Temp: 98.8 F (37.1 C) 98.6 F (37 C) 98.2 F (36.8 C)   TempSrc: Oral Oral Oral   SpO2:  100% 100% 100% 98%  Weight:      Height:        Intake/Output Summary (Last 24 hours) at 03/11/2018 1920 Last data filed at 03/11/2018 1450 Gross per 24 hour  Intake 900 ml  Output 600 ml  Net 300 ml   Filed Weights   03/08/18 2050 03/09/18 0457 03/10/18 0500  Weight: 56.1 kg 56.1 kg 56.1 kg    Examination:  General: No acute distress. Cardiovascular: Heart sounds show Sundeep Destin regular rate, and rhythm Lungs: Clear to auscultation bilaterally Abdomen: Soft, nontender, nondistended  Neurological: Alert and oriented 3. Moves all extremities 4. Cranial nerves II through XII grossly intact. Skin: Warm and dry. No rashes or lesions. Extremities: No clubbing or cyanosis. No edema.  Psychiatric: Mood and affect are normal. Insight and judgment are appropriate.    Data Reviewed: I have personally reviewed following labs and imaging studies  CBC: Recent Labs  Lab 03/06/18 0424 03/07/18 0413 03/08/18 0424 03/09/18 0624 03/11/18 0546  WBC 9.4 9.4 8.8 8.8 8.7  HGB 7.5* 7.3* 7.3* 7.6* 7.3*  HCT 21.5* 21.2* 21.8* 22.5* 21.2*  MCV 89.2 90.6 92.0 92.2 93.8  PLT 146* 156 167 173 867   Basic Metabolic Panel: Recent Labs  Lab 03/06/18 0424 03/07/18 0413 03/08/18 0424 03/09/18 0624 03/11/18 0546  NA 133* 136 140 138 141  K 3.7 3.8 3.7 3.8 3.5  CL 105 108 111 110 113*  CO2 21* 21* 22 23 23   GLUCOSE 91 84 103* 98 88  BUN <5* 5* 6* 5* 8  CREATININE 0.59 0.52 0.60 0.77 0.69  CALCIUM 8.7* 9.0 9.0 8.8* 8.7*  MG  --  1.5* 2.1  --   --    GFR: Estimated Creatinine Clearance: 62.9 mL/min (by C-G formula based on SCr of 0.69 mg/dL). Liver Function Tests: Recent Labs  Lab 03/05/18 0351 03/06/18 0424 03/07/18 0413 03/08/18 0424 03/09/18 0624  AST 70* 67* 62* 60* 60*  ALT 44 43 39 38 37  ALKPHOS 91 86 70 85 94  BILITOT 0.4 0.9 0.4 0.3 0.5  PROT 4.9* 5.3* 5.2* 5.3* 5.5*  ALBUMIN 2.3* 2.4* 2.4* 2.4* 2.4*   No results for input(s): LIPASE, AMYLASE in the last 168 hours. No  results for input(s): AMMONIA in the last 168 hours. Coagulation Profile: Recent Labs  Lab 03/06/18 0424  INR 1.1   Cardiac Enzymes: No results for input(s): CKTOTAL, CKMB, CKMBINDEX, TROPONINI in the last 168 hours. BNP (last 3 results) No results for input(s): PROBNP in the last 8760 hours. HbA1C: No results for input(s): HGBA1C in the last 72 hours. CBG: No results for input(s): GLUCAP in the last 168 hours. Lipid Profile: No results for input(s): CHOL, HDL, LDLCALC, TRIG, CHOLHDL, LDLDIRECT in the last 72 hours. Thyroid Function Tests: No results for input(s): TSH, T4TOTAL, FREET4, T3FREE, THYROIDAB in the last 72 hours. Anemia Panel: No results for input(s): VITAMINB12, FOLATE, FERRITIN, TIBC, IRON, RETICCTPCT in the last 72 hours. Sepsis Labs: No results for input(s): PROCALCITON, LATICACIDVEN in the last 168 hours.  No results found for this or any previous visit (from the past 240 hour(s)).       Radiology Studies: No results found.      Scheduled Meds: .  feeding supplement (ENSURE ENLIVE)  237 mL Oral BID BM  . folic acid  1 mg Oral Daily  . multivitamin with minerals  1 tablet Oral Daily  . nicotine  21 mg Transdermal Daily  . pantoprazole  40 mg Oral Daily  . [START ON 03/12/2018] thiamine  100 mg Oral TID   Continuous Infusions: . sodium chloride Stopped (03/01/18 0138)     LOS: 8 days    Time spent: over 20 min    Fayrene Helper, MD [facility] Pager AMION  If 7PM-7AM, please contact night-coverage [email or web address] Password [email or web address] 03/11/2018, 7:20 PM

## 2018-03-12 NOTE — Progress Notes (Signed)
Physical Therapy Treatment Patient Details Name: Tiffany Frye MRN: [id number] DOB: 1955/04/14 Today's Date: 03/12/2018    History of Present Illness Pt is a 63 y/o female with a PMH significant for HTN, ETOH abuse. She presents with AMS, and daughter states that mental status has been declining for months and acutely worsened in the past week. AMS felt to be related to heavy alcohol consumption and malnutrition. CT Head and CXR both negative for acute changes.    PT Comments    Pt progressing towards physical therapy goals. Tolerance for functional activity remains low and pt had 2 instances of LOB as she tripped over objects in the room upon return from gait training. Min assist provided for recovery. SNF remains appropriate.    Follow Up Recommendations  SNF;Supervision/Assistance - 24 hour     Equipment Recommendations  Rolling walker with 5" wheels    Recommendations for Other Services       Precautions / Restrictions Precautions Precautions: Fall Restrictions Weight Bearing Restrictions: No    Mobility  Bed Mobility Overal bed mobility: Modified Independent Bed Mobility: Sit to Supine           General bed mobility comments: No difficulty to return to supine  Transfers Overall transfer level: Needs assistance Equipment used: None Transfers: Sit to/from Stand Sit to Stand: Supervision;Min guard         General transfer comment: From low toilet, required min guard assist to power-up to full standing position. From chair, pt was able to power-up with supervision for safety.   Ambulation/Gait Ambulation/Gait assistance: Min guard;Min assist Gait Distance (Feet): 200 Feet Assistive device: None Gait Pattern/deviations: Step-through pattern;Decreased stride length;Trunk flexed Gait velocity: Decreased Gait velocity interpretation: 1.31 - 2.62 ft/sec, indicative of limited community ambulator General Gait Details: As pt fatigued, noted increased unsteadiness. Upon  return to room at end of session pt stumbled over 2 objects in room and required min assist to recover.    Stairs             Wheelchair Mobility    Modified Rankin (Stroke Patients Only)       Balance Overall balance assessment: Needs assistance Sitting-balance support: Feet supported;No upper extremity supported Sitting balance-Leahy Scale: Fair     Standing balance support: No upper extremity supported;During functional activity Standing balance-Leahy Scale: Fair Standing balance comment: Able to stand at sink and wash hands                            Cognition Arousal/Alertness: Awake/alert Behavior During Therapy: Impulsive Overall Cognitive Status: Impaired/Different from baseline Area of Impairment: Following commands;Safety/judgement;Awareness;Problem solving;Memory;Orientation                 Orientation Level: Disoriented to;Time;Situation   Memory: Decreased short-term memory Following Commands: Follows one step commands consistently;Follows one step commands with increased time;Follows multi-step commands inconsistently Safety/Judgement: Decreased awareness of safety;Decreased awareness of deficits Awareness: Intellectual Problem Solving: Slow processing;Requires verbal cues General Comments: Continue to see improvement with overall mentation      Exercises      General Comments        Pertinent Vitals/Pain Pain Assessment: No/denies pain    Home Living                      Prior Function            PT Goals (current goals can now be found in the care plan  section) Acute Rehab PT Goals Patient Stated Goal: Be able to walk for exercise PT Goal Formulation: With patient Time For Goal Achievement: 03/17/18 Potential to Achieve Goals: Good Progress towards PT goals: Progressing toward goals    Frequency    Min 2X/week      PT Plan Discharge plan needs to be updated;Frequency needs to be updated     Co-evaluation              AM-PAC PT "6 Clicks" Mobility   Outcome Measure  Help needed turning from your back to your side while in a flat bed without using bedrails?: None Help needed moving from lying on your back to sitting on the side of a flat bed without using bedrails?: None Help needed moving to and from a bed to a chair (including a wheelchair)?: None Help needed standing up from a chair using your arms (e.g., wheelchair or bedside chair)?: None Help needed to walk in hospital room?: None Help needed climbing 3-5 steps with a railing? : A Little 6 Click Score: 23    End of Session Equipment Utilized During Treatment: Gait belt Activity Tolerance: Patient tolerated treatment well Patient left: in bed;with call bell/phone within reach;with bed alarm set Nurse Communication: Mobility status PT Visit Diagnosis: Unsteadiness on feet (R26.81);Muscle weakness (generalized) (M62.81);Difficulty in walking, not elsewhere classified (R26.2)     Time: 3383-2919 PT Time Calculation (min) (ACUTE ONLY): 23 min  Charges:  $Gait Training: 23-37 mins                     Rolinda Roan, PT, DPT Acute Rehabilitation Services Pager: [phone] Office: [phone]    Thelma Comp 03/12/2018, 2:56 PM

## 2018-03-12 NOTE — Progress Notes (Signed)
PROGRESS NOTE  Tiffany Frye WUJ:[id number] DOB: [birth year] DOA: 02/28/2018 PCP: Wenda Low, MD  HPI/Recap of past 24 hours:  Feeling stronger, wants to get up to chair Denies pain, oriented x3 Awaiting for snf placement  Assessment/Plan: Principal Problem:   Acute metabolic encephalopathy Active Problems:   Alcohol abuse   High cholesterol   Hypertension   Hyponatremia   Hyperkalemia   Malnutrition of moderate degree   1. Acute metabolic encephalopathy:Unclear etiology.  CT head without acute findings. Ammonia: 26, UDS positive for THC, UA not consistent with UTI, HIV screen negative, acute hepatitis panel negative, B1 level low 24.6, TSH normal/1.94, folate low at 3.6, B12/1088, RPR nonreactive. Suspected due to alcohol abuse, dehydration from poor oral intake complicating possible underlying dementia. Discussed in detail with patient's daughter on 2/25, gradual decline in mental status over the last 3 months but abruptly in the few days preceding admission, mental status has improved but keeps repeating same questions that have been answered again and again.  She seems improved on 2/26.  Suspect delirium as well. 1. Continue thiamine, deficiency as noted above (s/p 500 mg TID, will plan to use 100 mg TID for 1-2 weeks then 100 mg daily) 2. Delirium precautions 3. Outpatient follow up with neurology 4. MRI without acute intracranial process (mod to severe parenchymal brain volume loss, severe white matter changes most compatible with chronic small vessel ischemic changes).   2. Anemia of chronic disease/folate deficiency:No bleeding reported. Suspect anemia to be multifactorial related to bone marrow suppression from chronic alcohol abuse, folate deficiency versus others.  1. Fluctuating over past few days.  Lower than at admission.  ? Dilution with IVF.  Stable. 2. Labs suggestive of AOCD with folate deficiency  3. Needs further outpatient follow up  4. Colonoscopy 2017  with tubular adenomas 5. FOBT negative  3. Alcohol dependence:Abstinence counseled. Treated per CIWAprotocol. No overt withdrawal noted. Continue thiamine, folate and multivitamins.  4. Tobacco abuse: Cessation counseled. Nicotine patch.  5. Essential hypertension: Stable. Cozaar currently on hold, may consider resuming at discharge.  6. Hyperlipidemia: Statins held due to abnormal LFTs, may consider resuming at discharge.  7. GERD: Continue PPI.  8. Hyponatremia: Sodium 127 on admission. Suspect multifactorial due to poor oral intake, associated dehydration, beer potomania. Improved.  9. Hypokalemia/hypomagnesemia: Improved.  10. Thrombocytopenia:Suspect due to alcohol abuse. Continue to monitor.  Imrpoved..  11. Abnormal LFTs:Likely 2/2 etoh use. Acute hepatitis panel negative. Slowly improving. Alcohol abstinence.  Continue to monitor.  Improving.  Follow LFTs, INR.    12. Nonsevere (moderate) malnutrition in context of chronic illness: dietician c/s  DVT prophylaxis: SCD Code Status: full  Family Communication: patient   Disposition Plan: SNF when bed is available  Patient is improving,tele has been unremarkable, will d/c tele. Consultants:  none  Procedures:  none  Antibiotics:  none   Objective: BP (!) 106/54 (BP Location: Left Arm)   Pulse 78   Temp 97.6 F (36.4 C) (Oral)   Resp 20   Ht 5\' 5"  (1.651 m)   Wt 56.1 kg   SpO2 100%   BMI 20.58 kg/m   Intake/Output Summary (Last 24 hours) at 03/12/2018 1223 Last data filed at 03/12/2018 1153 Gross per 24 hour  Intake 660 ml  Output 750 ml  Net -90 ml   Filed Weights   03/08/18 2050 03/09/18 0457 03/10/18 0500  Weight: 56.1 kg 56.1 kg 56.1 kg    Exam: Patient is examined daily including today on 03/12/2018, exams  remain the same as of yesterday except that has changed    General:  NAD, alert, pleasant, oriented x3  Cardiovascular: RRR  Respiratory: CTABL  Abdomen:  Soft/ND/NT, positive BS  Musculoskeletal: No Edema  Neuro: alert, oriented   Data Reviewed: Basic Metabolic Panel: Recent Labs  Lab 03/06/18 0424 03/07/18 0413 03/08/18 0424 03/09/18 0624 03/11/18 0546  NA 133* 136 140 138 141  K 3.7 3.8 3.7 3.8 3.5  CL 105 108 111 110 113*  CO2 21* 21* 22 23 23   GLUCOSE 91 84 103* 98 88  BUN <5* 5* 6* 5* 8  CREATININE 0.59 0.52 0.60 0.77 0.69  CALCIUM 8.7* 9.0 9.0 8.8* 8.7*  MG  --  1.5* 2.1  --   --    Liver Function Tests: Recent Labs  Lab 03/06/18 0424 03/07/18 0413 03/08/18 0424 03/09/18 0624  AST 67* 62* 60* 60*  ALT 43 39 38 37  ALKPHOS 86 70 85 94  BILITOT 0.9 0.4 0.3 0.5  PROT 5.3* 5.2* 5.3* 5.5*  ALBUMIN 2.4* 2.4* 2.4* 2.4*   No results for input(s): LIPASE, AMYLASE in the last 168 hours. No results for input(s): AMMONIA in the last 168 hours. CBC: Recent Labs  Lab 03/06/18 0424 03/07/18 0413 03/08/18 0424 03/09/18 0624 03/11/18 0546  WBC 9.4 9.4 8.8 8.8 8.7  HGB 7.5* 7.3* 7.3* 7.6* 7.3*  HCT 21.5* 21.2* 21.8* 22.5* 21.2*  MCV 89.2 90.6 92.0 92.2 93.8  PLT 146* 156 167 173 170   Cardiac Enzymes:   No results for input(s): CKTOTAL, CKMB, CKMBINDEX, TROPONINI in the last 168 hours. BNP (last 3 results) No results for input(s): BNP in the last 8760 hours.  ProBNP (last 3 results) No results for input(s): PROBNP in the last 8760 hours.  CBG: No results for input(s): GLUCAP in the last 168 hours.  No results found for this or any previous visit (from the past 240 hour(s)).   Studies: No results found.  Scheduled Meds: . feeding supplement (ENSURE ENLIVE)  237 mL Oral BID BM  . folic acid  1 mg Oral Daily  . multivitamin with minerals  1 tablet Oral Daily  . nicotine  21 mg Transdermal Daily  . pantoprazole  40 mg Oral Daily  . thiamine  100 mg Oral TID    Continuous Infusions: . sodium chloride Stopped (03/01/18 0138)     Time spent: 70mins I have personally reviewed and interpreted on   03/12/2018 daily labs, tele strips, imagings as discussed above under date review session and assessment and plans.  I reviewed all nursing notes, pharmacy notes,  vitals, pertinent old records  I have discussed plan of care as described above with RN , patient  on 03/12/2018   Florencia Reasons MD, PhD  [facility] Pager [phone]. If 7PM-7AM, please contact night-coverage at [email or web address], password [facility] 03/12/2018, 12:23 PM  LOS: 9 days

## 2018-03-13 DIAGNOSIS — E876 Hypokalemia: Secondary | ICD-10-CM

## 2018-03-13 DIAGNOSIS — R945 Abnormal results of liver function studies: Secondary | ICD-10-CM

## 2018-03-13 DIAGNOSIS — D696 Thrombocytopenia, unspecified: Secondary | ICD-10-CM

## 2018-03-13 DIAGNOSIS — R410 Disorientation, unspecified: Secondary | ICD-10-CM

## 2018-03-13 DIAGNOSIS — R7989 Other specified abnormal findings of blood chemistry: Secondary | ICD-10-CM

## 2018-03-13 DIAGNOSIS — E519 Thiamine deficiency, unspecified: Secondary | ICD-10-CM

## 2018-03-13 MED ORDER — FOLIC ACID 1 MG PO TABS: 1.0000 mg | ORAL_TABLET | Freq: Every day | ORAL | 0 refills | Status: DC

## 2018-03-13 MED ORDER — VITAMIN B-1 100 MG PO TABS: 100.0000 mg | ORAL_TABLET | Freq: Every day | ORAL | 3 refills | Status: AC

## 2018-03-13 MED ORDER — ADULT MULTIVITAMIN W/MINERALS CH: 1.0000 | ORAL_TABLET | Freq: Every day | ORAL | 0 refills | Status: AC

## 2018-03-13 MED ORDER — THIAMINE HCL 100 MG PO TABS: 100.0000 mg | ORAL_TABLET | Freq: Three times a day (TID) | ORAL | 0 refills | Status: AC

## 2018-03-13 MED ORDER — ENSURE ENLIVE PO LIQD: 237.0000 mL | Freq: Two times a day (BID) | ORAL | 12 refills | Status: DC

## 2018-03-13 NOTE — Discharge Summary (Signed)
Discharge Summary  Tiffany Frye ZOX:[id number] DOB: February 18, 1955  PCP: Tiffany Low, MD  Admit date: 02/28/2018 Discharge date: 03/13/2018  Time spent: 71mins, more than 50% time spent on coordination of care.  Recommendations for Outpatient Follow-up:  1. F/u with SNF MD for hospital discharge follow up, repeat cbc/bmp at follow up. SNF MD to monitor anemia, liver function  2. F/u with neurology for memory impairment   Discharge Diagnoses:  Active Hospital Problems   Diagnosis Date Noted  . Acute metabolic encephalopathy 81/19/1478  . Malnutrition of moderate degree 03/03/2018  . Hyponatremia 03/01/2018  . Hyperkalemia 03/01/2018  . Alcohol abuse   . High cholesterol   . Hypertension     Resolved Hospital Problems  No resolved problems to display.    Discharge Condition: stable  Diet recommendation: regular diet  Filed Weights   03/08/18 2050 03/09/18 0457 03/10/18 0500  Weight: 56.1 kg 56.1 kg 56.1 kg    History of present illness: (per admitting MD Dr Tiffany Frye)  PCP: System, Pcp Not In   Patient coming from:  The patient is coming from home.  At baseline, pt is independent for most of ADL.        Chief Complaint: Confusion  HPI: Tiffany Frye is a 63 y.o. female with medical history significant of Hypertension, hyperlipidemia, GERD, depression, alcohol and tobacco abuse, who presents with confusion and altered mental status.  Per patient's daughter, patient's mental status has been declining in the past 3 to 4 months, but much worse in the past 7 days.  Patient becomes more confused, asking questions repeatedly, talking out of her head.  She moves all extremities normally, no unilateral weakness, facial droop, slurred speech.  Does not seem to have vision loss or hearing loss.  She does not seem to have chest pain.  She has mild cough and shortness of breath due to smoking, which is close to baseline.  No fever or chills.  Patient has loose stool bowel movement, but no  active nausea vomiting or abdominal pain.  Not sure if patient any symptoms of UTI.  ED Course: pt was found to have negative troponin, WBC 8.4, sodium 127, potassium 5.6 (has blood hemolysis), creatinine and BUN normal, ammonia level 26.  Temperature normal, no tachycardia, oxygen saturation 100% on room air, negative chest x-ray, negative CT head for acute intracranial abnormalities.  Patient is placed on telemetry bed for observation. Hospital Course:  Principal Problem:   Acute metabolic encephalopathy Active Problems:   Alcohol abuse   High cholesterol   Hypertension   Hyponatremia   Hyperkalemia   Malnutrition of moderate degree   1. Acute metabolic encephalopathy:Unclear etiology. CT head without acute findings. Ammonia: 26, UDS positive for THC, UA not consistent with UTI, HIV screen negative, acute hepatitis panel negative, B1 level Frye 24.6, TSH normal/1.94, folate Frye at 3.6, B12/1088, RPR nonreactive. Suspected due to alcohol abuse, dehydration from poor oral intake complicating possible underlying dementia. could not rule out delirium.per patient's daughter, patient has been having gradual decline in mental status over the last 3 months but abruptly in the few days preceding admission, mental status has improved at discharge, she is now aaox3, pleasant and appropriate. 1. Continue thiamine, deficiency as noted above (s/p 500 mg TID, plan to use 100 mg TID for 1week then then 100 mg daily) 2. Delirium precautions 3. Outpatient follow up with neurology 4. MRI without acute intracranial process (mod to severe parenchymal brain volume loss, severe white matter changes most compatible with  chronic small vessel ischemic changes).   2. Anemia of chronic disease/folate deficiency:hgb >7, No bleeding reported. Suspect anemia to be multifactorial related to bone marrow suppression from chronic alcohol abuse, folate deficiencyversus others.  1. Fluctuating over past few days.  Lower than at admission. ? Dilution with IVF. Stable. 2. Labs suggestive of AOCD with folate deficiency  3. Needs further outpatient follow up  4. Colonoscopy 2017 with tubular adenomas 5. FOBT negative  3. Alcohol dependence:Abstinence counseled. Treated per CIWAprotocol. No overt withdrawal noted. Continue thiamine, folate and multivitamins.  4. Tobacco abuse: Cessation counseled. Nicotine patch.  5. Essential hypertension: Stable. home meds held in the hospital, discontinued at discharge. pcp to continue monitor blood pressure,  may consider resuming at discharge follow up if bp start to increase, currently sbp has been mostly around 110's without bp meds  6. Hyperlipidemia: Statins held due to abnormal LFTs, lft improving, resume statin at discharge.  7. GERD: Continue PPI.  8. Hyponatremia: Sodium 127 on admission. Suspect multifactorial due to poor oral intake, associated dehydration, beer potomania. resolved.  9. Hypokalemia/hypomagnesemia: supplemented and Improved.  10. Thrombocytopenia:plt 96 on presentation, Suspect due to alcohol abuse. Continue to monitor. platelet normalized at discharge.  11. Abnormal LFTs:INR wnl, Likely 2/2 etoh use. Acute hepatitis panel negative. Slowly improving. Alcohol abstinence. Continue to monitor. Improving.   12. Nonsevere (moderate) malnutrition in context of chronic illness:dietician c/s  DVT prophylaxis:SCD Code Status:full  Family Communication: patient   Disposition Plan: SNF    Consultants:  none  Procedures:  none  Antibiotics:  none   Discharge Exam: BP 122/68 (BP Location: Right Arm)   Pulse 79   Temp 98.6 F (37 C) (Oral)   Resp 18   Ht 5\' 5"  (1.651 m)   Wt 56.1 kg   SpO2 100%   BMI 20.58 kg/m   General: NAD, AAOx3 Cardiovascular: RRR Respiratory: CTABL  Discharge Instructions You were cared for by a hospitalist during your hospital stay. If you have any  questions about your discharge medications or the care you received while you were in the hospital after you are discharged, you can call the unit and asked to speak with the hospitalist on call if the hospitalist that took care of you is not available. Once you are discharged, your primary care physician will handle any further medical issues. Please note that NO REFILLS for any discharge medications will be authorized once you are discharged, as it is imperative that you return to your primary care physician (or establish a relationship with a primary care physician if you do not have one) for your aftercare needs so that they can reassess your need for medications and monitor your lab values.  Discharge Instructions    Diet general   Complete by:  As directed    Increase activity slowly   Complete by:  As directed      Allergies as of 03/13/2018      Reactions   Penicillins Itching   "Skin itched", denies breathing problems. Has patient had a PCN reaction causing immediate rash, facial/tongue/throat swelling, SOB or lightheadedness with hypotension: Yes Has patient had a PCN reaction causing severe rash involving mucus membranes or skin necrosis: No Has patient had a PCN reaction that required hospitalization: No Has patient had a PCN reaction occurring within the last 10 years: No If all of the above answers are "NO", then may proceed with Cephalosporin use.      Medication List    STOP taking these  medications   losartan 50 MG tablet Commonly known as:  COZAAR     TAKE these medications   feeding supplement (ENSURE ENLIVE) Liqd Take 237 mLs by mouth 2 (two) times daily between meals.   folic acid 1 MG tablet Commonly known as:  FOLVITE Take 1 tablet (1 mg total) by mouth daily. Start taking on:  March 14, 2018   multivitamin with minerals Tabs tablet Take 1 tablet by mouth daily. Start taking on:  March 14, 2018   pantoprazole 40 MG tablet Commonly known as:  PROTONIX Take  40 mg by mouth daily.   simvastatin 20 MG tablet Commonly known as:  ZOCOR Take 20 mg by mouth every evening.   thiamine 100 MG tablet Take 1 tablet (100 mg total) by mouth 3 (three) times daily for 7 days.   thiamine 100 MG tablet Commonly known as:  VITAMIN B-1 Take 1 tablet (100 mg total) by mouth daily. Start taking on:  March 21, 2018      Allergies  Allergen Reactions  . Penicillins Itching    "Skin itched", denies breathing problems. Has patient had a PCN reaction causing immediate rash, facial/tongue/throat swelling, SOB or lightheadedness with hypotension: Yes Has patient had a PCN reaction causing severe rash involving mucus membranes or skin necrosis: No Has patient had a PCN reaction that required hospitalization: No Has patient had a PCN reaction occurring within the last 10 years: No If all of the above answers are "NO", then may proceed with Cephalosporin use.     Contact information for follow-up providers    [facility] Follow up in 1 month(s).   Why:  for memory issues Contact information: [address]     [place] [address] [phone]           Contact information for after-discharge care    Destination    [facility] .   Service:  Skilled Nursing Contact information: [address] [place] [phone]                   The results of significant diagnostics from this hospitalization (including imaging, microbiology, ancillary and laboratory) are listed below for reference.    Significant Diagnostic Studies: Dg Chest 2 View  Result Date: 02/28/2018 CLINICAL DATA:  Shortness of breath EXAM: CHEST - 2 VIEW COMPARISON:  03/07/2017 FINDINGS: The heart size and mediastinal contours are within normal limits. Both lungs are clear. The visualized skeletal structures are unremarkable. Aortic atherosclerosis. IMPRESSION: No active  cardiopulmonary disease. Electronically Signed   By: Donavan Foil M.D.   On: 02/28/2018 22:39   Ct Head Wo Contrast  Result Date: 02/28/2018 CLINICAL DATA:  Altered LOC EXAM: CT HEAD WITHOUT CONTRAST TECHNIQUE: Contiguous axial images were obtained from the base of the skull through the vertex without intravenous contrast. COMPARISON:  CT brain 08/31/2016 FINDINGS: Brain: No acute territorial infarction, hemorrhage or intracranial mass. Moderate atrophy. Moderate to marked small vessel ischemic changes of the white matter. Stable enlarged ventricles. Vascular: No hyperdense vessels. Vertebral and carotid vascular calcification Skull: Normal. Negative for fracture or focal lesion. Sinuses/Orbits: Chronic fracture deformity of the left medial orbital wall. Other: None IMPRESSION: 1. No CT evidence for acute intracranial abnormality. 2. Atrophy and small vessel ischemic changes of the white matter Electronically Signed   By: Donavan Foil M.D.   On: 02/28/2018 22:05   Mr Brain Wo Contrast  Result Date: 03/04/2018 CLINICAL DATA:  Worsening cognitive decline, confusion for 1 week. Evaluate acute metabolic encephalopathy. History of alcohol abuse, hypertension hypercholesterolemia. EXAM: MRI HEAD WITHOUT CONTRAST TECHNIQUE: Multiplanar, multiecho pulse sequences of the brain and surrounding structures were obtained without intravenous contrast. COMPARISON:  CT HEAD February 28, 2018. FINDINGS: INTRACRANIAL CONTENTS: No reduced diffusion to suggest acute ischemia, status epilepticus or infection. No susceptibility artifact to suggest hemorrhage. Moderate to severe global parenchymal brain volume loss for age. No hydrocephalus. Confluent supratentorial and pontine white matter FLAIR T2 hyperintensities. Patchy T2 hyperintensities bilateral basal ganglia associated with chronic small vessel ischemic changes. No suspicious parenchymal signal, masses, mass effect. No abnormal extra-axial fluid collections. No  extra-axial masses. VASCULAR: Normal major intracranial vascular flow voids present at skull base. SKULL AND UPPER CERVICAL SPINE: No abnormal sellar expansion. No suspicious calvarial bone marrow signal. Craniocervical junction maintained. SINUSES/ORBITS: Minimal LEFT ethmoid mucosal thickening.The included ocular globes and orbital contents are non-suspicious. OTHER: None. IMPRESSION: 1. No acute intracranial process. 2. Moderate to severe parenchymal brain volume loss, advanced for age. 3. Severe white matter changes most compatible chronic small vessel ischemic changes though there could be a component of metabolic/toxic leukoencephalopathy. Electronically Signed   By: Elon Alas M.D.   On: 03/04/2018 22:23    Microbiology: No results found for this or any previous visit (from the past 240 hour(s)).   Labs: Basic Metabolic Panel: Recent Labs  Lab 03/07/18 0413 03/08/18 0424 03/09/18 0624 03/11/18 0546  NA 136 140 138 141  K 3.8 3.7 3.8 3.5  CL 108 111 110 113*  CO2 21* 22 23 23   GLUCOSE 84 103* 98 88  BUN 5* 6* 5* 8  CREATININE 0.52 0.60 0.77 0.69  CALCIUM 9.0 9.0 8.8* 8.7*  MG 1.5* 2.1  --   --    Liver Function Tests: Recent Labs  Lab 03/07/18 0413 03/08/18 0424 03/09/18 0624  AST 62* 60* 60*  ALT 39 38 37  ALKPHOS 70 85 94  BILITOT 0.4 0.3 0.5  PROT 5.2* 5.3* 5.5*  ALBUMIN 2.4* 2.4* 2.4*   No results for input(s): LIPASE, AMYLASE in the last 168 hours. No results for input(s): AMMONIA in the last 168 hours. CBC: Recent Labs  Lab 03/07/18 0413 03/08/18 0424 03/09/18 0624 03/11/18 0546  WBC 9.4 8.8 8.8 8.7  HGB 7.3* 7.3* 7.6* 7.3*  HCT 21.2* 21.8* 22.5* 21.2*  MCV 90.6 92.0 92.2 93.8  PLT 156 167 173 170   Cardiac Enzymes: No results for input(s): CKTOTAL, CKMB, CKMBINDEX, TROPONINI in the last 168 hours. BNP: BNP (last 3 results) No results for input(s): BNP in the last 8760 hours.  ProBNP (last 3 results) No results for input(s): PROBNP in the  last 8760 hours.  CBG: No results for input(s): GLUCAP in the last 168 hours.     Signed:  Florencia Reasons MD, PhD  [facility] 03/13/2018, 12:39 PM

## 2018-03-13 NOTE — Clinical Social Work Placement (Signed)
   CLINICAL SOCIAL WORK PLACEMENT  NOTE *03/13/18 - DISCHARGED TO [facility]IA [facility]BULANCE  Date:  03/13/2018  Patient Details  Name: Tiffany Frye MRN: [id number] Date of Birth: 10-May-1955  Clinical Social Work is seeking post-discharge placement for this patient at the [place] level of care (*CSW will initial, date and re-position this form in  chart as items are completed):  No   Patient/family provided with [place] Work Department's list of facilities offering this level of care within the geographic area requested by the patient (or if unable, by the patient's family).  Yes   Patient/family informed of their freedom to choose among providers that offer the needed level of care, that participate in Medicare, Medicaid or managed care program needed by the patient, have an available bed and are willing to accept the patient.  No   Patient/family informed of [facility]'s ownership interest in [facility] and [facility], as well as of the fact that they are under no obligation to receive care at these facilities.  PASRR submitted to EDS on 03/04/18     PASRR number received on 03/04/18     Existing PASRR number confirmed on       FL2 transmitted to all facilities in geographic area requested by pt/family on 03/08/18     FL2 transmitted to all facilities within larger geographic area on       Patient informed that his/her managed care company has contracts with or will negotiate with certain facilities, including the following:        Yes   Patient/family informed of bed offers received.  Patient chooses bed at [facility]     Physician recommends and patient chooses bed at      Patient to be transferred to [facility] on  03/13/18.  Patient to be transferred to facility by  Ambulance     Patient family notified on  03/13/18 of transfer.  Name of family member notified:   Georganna Skeans, [phone]      PHYSICIAN       Additional Comment:    _______________________________________________ Sable Feil, LCSW 03/13/2018, 3:31 PM

## 2018-12-15 ENCOUNTER — Other Ambulatory Visit: Payer: Self-pay | Admitting: Internal Medicine

## 2018-12-15 DIAGNOSIS — Z1231 Encounter for screening mammogram for malignant neoplasm of breast: Secondary | ICD-10-CM

## 2019-02-03 ENCOUNTER — Ambulatory Visit
Admission: RE | Admit: 2019-02-03 | Discharge: 2019-02-03 | Disposition: A | Discharge status: Home / Self Care | Payer: Medicare Other | Source: Ambulatory Visit | Attending: Internal Medicine | Admitting: Internal Medicine

## 2019-02-03 ENCOUNTER — Other Ambulatory Visit: Payer: Self-pay

## 2019-02-03 DIAGNOSIS — Z1231 Encounter for screening mammogram for malignant neoplasm of breast: Secondary | ICD-10-CM

## 2019-03-22 IMAGING — MR MR HEAD W/O CM
9 of 11 series · 33 of 48 positions shown · non-contrast
Comparison: CT HEAD February 28, 2018.

CLINICAL DATA: Worsening cognitive decline, confusion for 1 week.
Evaluate acute metabolic encephalopathy. History of alcohol abuse,
hypertension hypercholesterolemia.

EXAM:
MRI HEAD WITHOUT CONTRAST
TECHNIQUE: Multiplanar, multiecho pulse sequences of the brain and surrounding
structures were obtained without intravenous contrast.

[Series 3: DWI · axial · 3.0mm · 0.94mm/px · z∈[-67,+80]mm · 7 of 99 slices shown (1 of 2)]
[im 1/99]
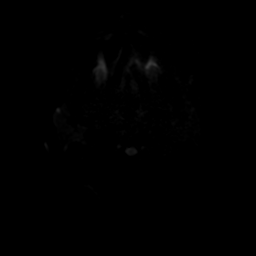
[im 17/99]
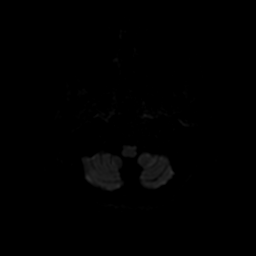
[im 33/99]
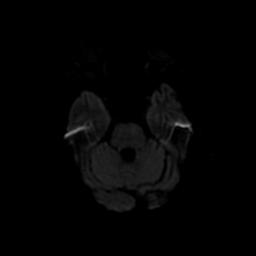
[im 50/99]
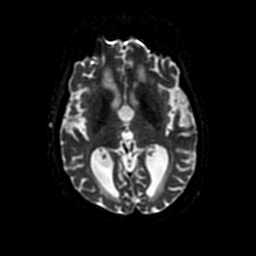
[im 66/99]
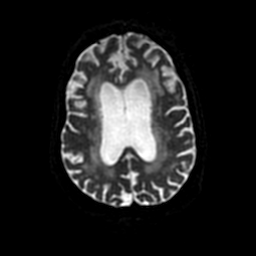
[im 82/99]
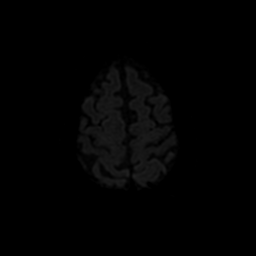
[im 99/99]
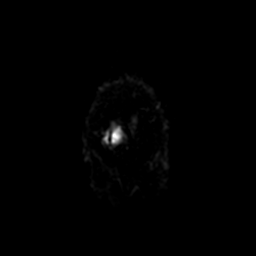

[Series 4: FLAIR · axial · 3.0mm · 0.47mm/px · z∈[-65,+78]mm · 2 of 25 slices shown (1 of 2)]
[im 1/25]
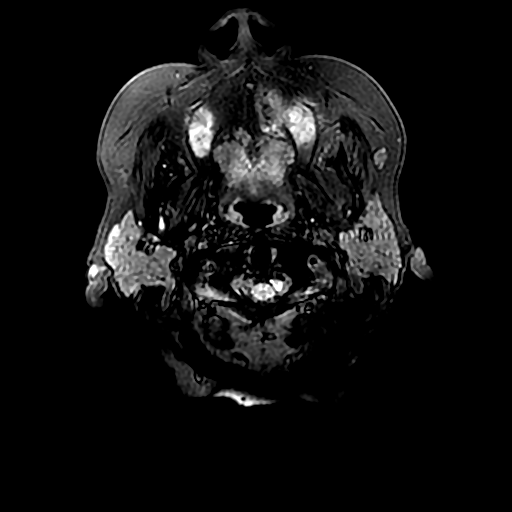
[im 25/25]
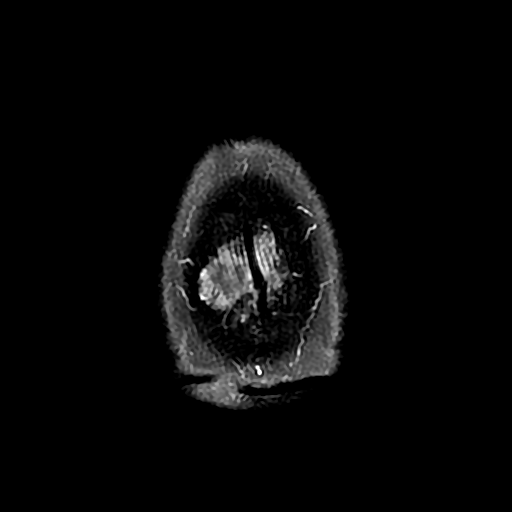

[Series 5: (person_name) · axial · 3.0mm · 0.47mm/px · z∈[-67,+17]mm · 5 of 100 slices shown]
[im 1/100]
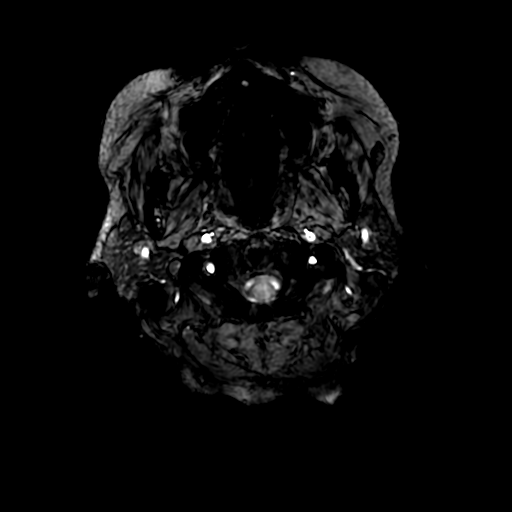
[im 15/100]
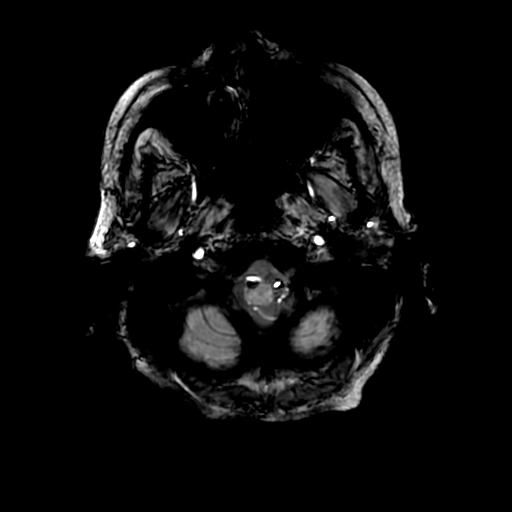
[im 29/100]
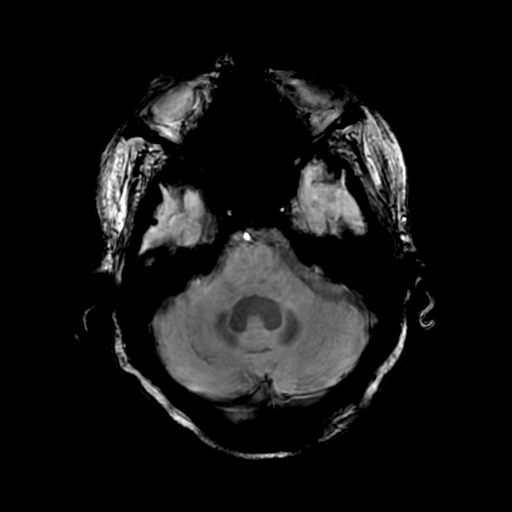
[im 43/100]
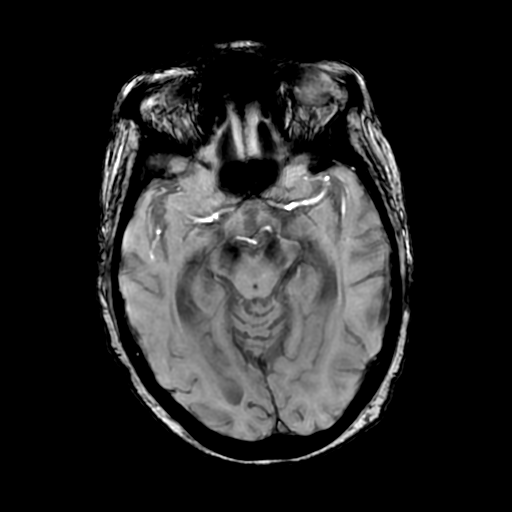
[im 57/100]
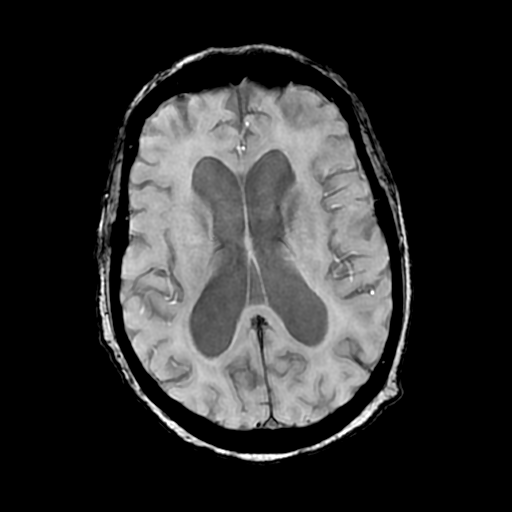

[Series 6: DWI · coronal · 4.0mm · 0.94mm/px · 6 of 72 slices shown (2 of 2)]
[im 1/72]
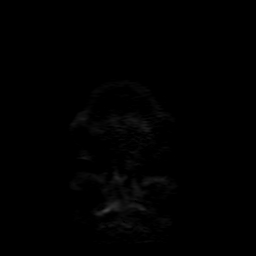
[im 15/72]
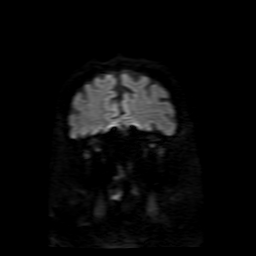
[im 29/72]
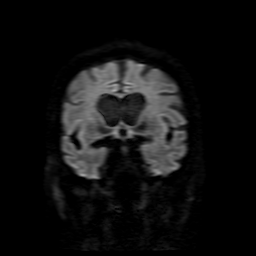
[im 43/72]
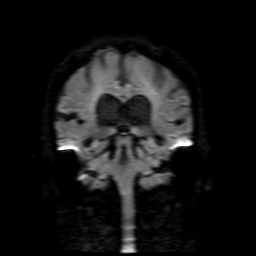
[im 57/72]
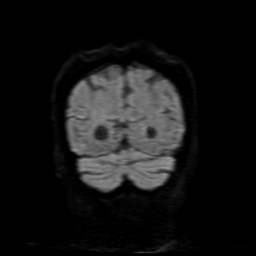
[im 72/72]
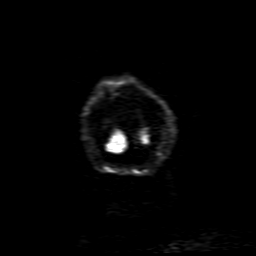

[Series 7: FLAIR · sagittal · 5.0mm · 0.47mm/px · 2 of 23 slices shown (2 of 2)]
[im 1/23]
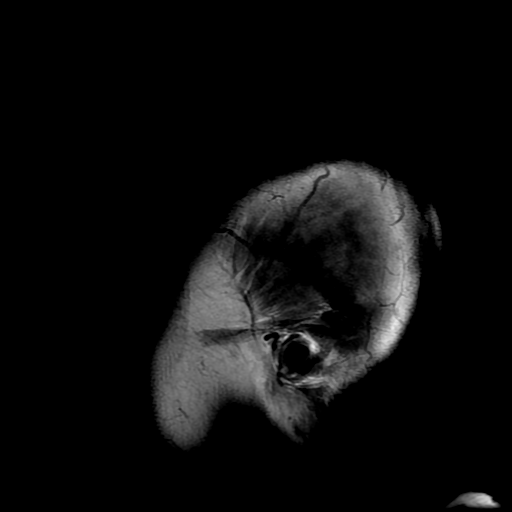
[im 23/23]
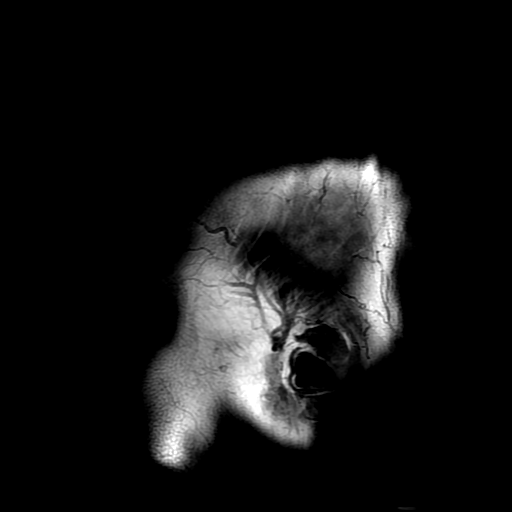

[Series 8: T2 · axial · 5.0mm · 0.47mm/px · z∈[-65,+78]mm · 2 of 25 slices shown (1 of 2)]
[im 1/25]
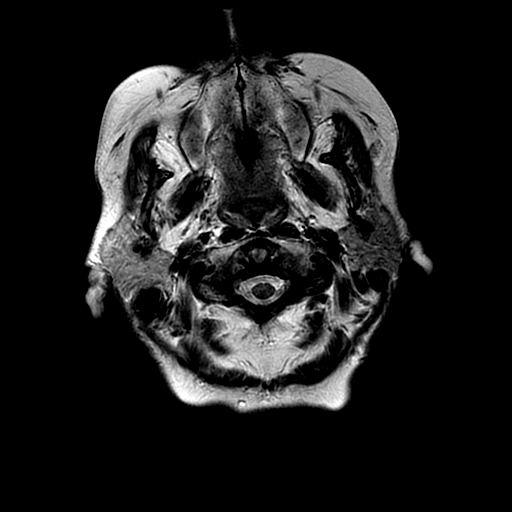
[im 25/25]
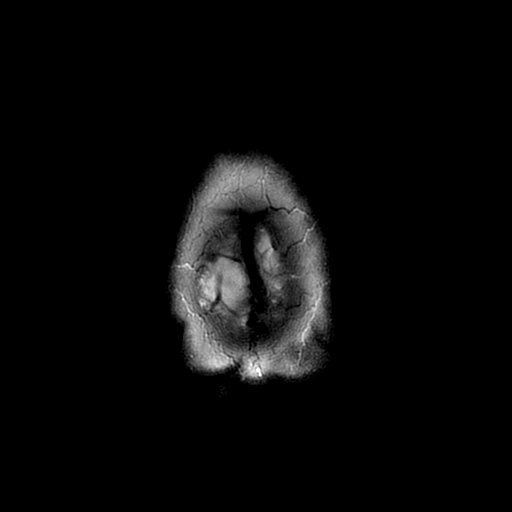

[Series 10: T2 · coronal · 5.0mm · 0.43mm/px · 2 of 30 slices shown (2 of 2)]
[im 1/30]
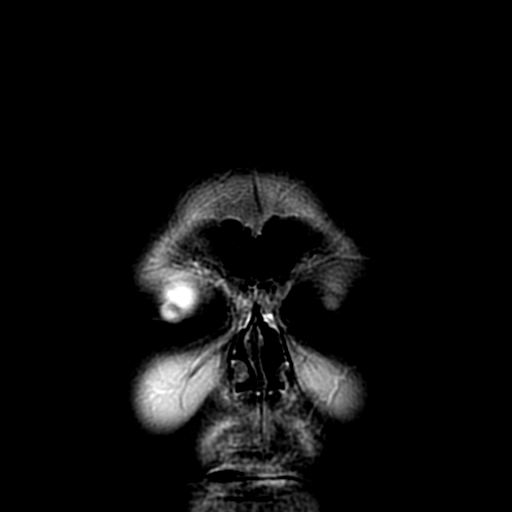
[im 30/30]
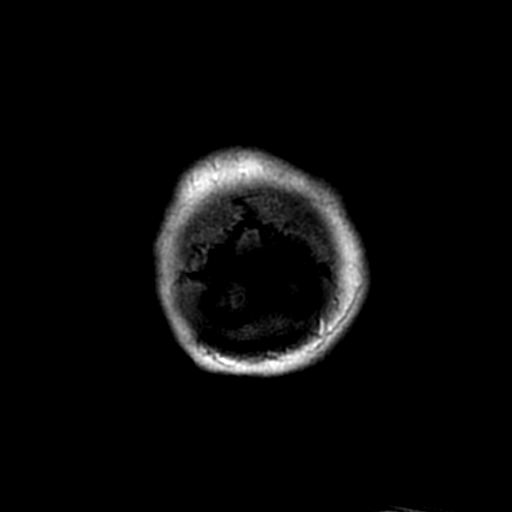

[Series 350: ADC · axial · 3.0mm · 0.94mm/px · z∈[-67,+80]mm · 4 of 50 slices shown (1 of 2)]
[im 1/50]
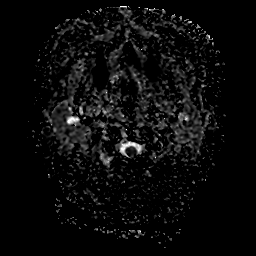
[im 17/50]
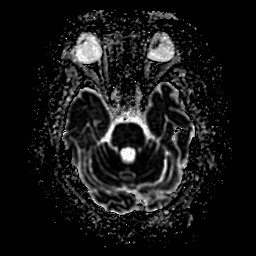
[im 33/50]
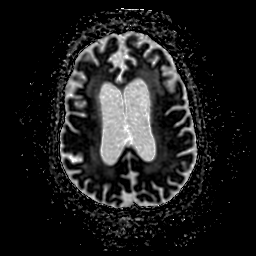
[im 50/50]
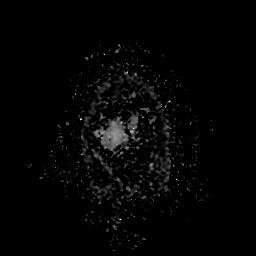

[Series 650: ADC · coronal · 4.0mm · 0.94mm/px · 3 of 36 slices shown (2 of 2)]
[im 1/36]
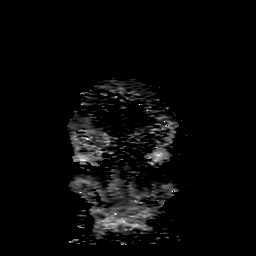
[im 18/36]
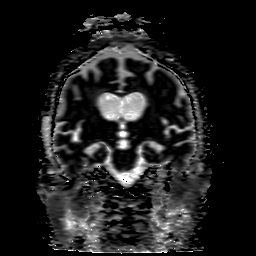
[im 36/36]
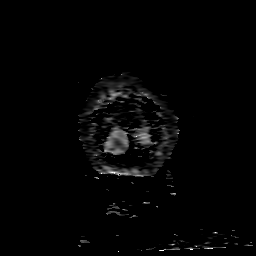

[33 of 48 positions shown; findings below may reference images not displayed]

FINDINGS: INTRACRANIAL CONTENTS: No reduced diffusion to suggest acute
ischemia, status epilepticus or infection. No susceptibility
artifact to suggest hemorrhage. Moderate to severe global
parenchymal brain volume loss for age. No hydrocephalus. Confluent
supratentorial and pontine white matter FLAIR T2 hyperintensities.
Patchy T2 hyperintensities bilateral basal ganglia associated with
chronic small vessel ischemic changes. No suspicious parenchymal
signal, masses, mass effect. No abnormal extra-axial fluid
collections. No extra-axial masses.

VASCULAR: Normal major intracranial vascular flow voids present at
skull base.

SKULL AND UPPER CERVICAL SPINE: No abnormal sellar expansion. No
suspicious calvarial bone marrow signal. Craniocervical junction
maintained.

SINUSES/ORBITS: Minimal LEFT ethmoid mucosal thickening.The included
ocular globes and orbital contents are non-suspicious.

OTHER: None.
IMPRESSION: 1. No acute intracranial process.
2. Moderate to severe parenchymal brain volume loss, advanced for
age.
3. Severe white matter changes most compatible chronic small vessel
ischemic changes though there could be a component of
metabolic/toxic leukoencephalopathy.

## 2019-04-01 ENCOUNTER — Other Ambulatory Visit: Payer: Self-pay | Admitting: Internal Medicine

## 2019-04-01 DIAGNOSIS — F17210 Nicotine dependence, cigarettes, uncomplicated: Secondary | ICD-10-CM

## 2019-04-14 ENCOUNTER — Ambulatory Visit
Admission: RE | Admit: 2019-04-14 | Discharge: 2019-04-14 | Disposition: A | Discharge status: Home / Self Care | Payer: Medicare Other | Source: Ambulatory Visit | Attending: Internal Medicine | Admitting: Internal Medicine

## 2019-04-14 ENCOUNTER — Other Ambulatory Visit: Payer: Self-pay

## 2019-04-14 DIAGNOSIS — F17210 Nicotine dependence, cigarettes, uncomplicated: Secondary | ICD-10-CM

## 2019-08-10 ENCOUNTER — Other Ambulatory Visit: Payer: Self-pay | Admitting: Internal Medicine

## 2019-08-10 DIAGNOSIS — E2839 Other primary ovarian failure: Secondary | ICD-10-CM

## 2019-10-14 ENCOUNTER — Ambulatory Visit
Admission: RE | Admit: 2019-10-14 | Discharge: 2019-10-14 | Disposition: A | Discharge status: Home / Self Care | Payer: Medicare Other | Source: Ambulatory Visit | Attending: Internal Medicine | Admitting: Internal Medicine

## 2019-10-14 ENCOUNTER — Other Ambulatory Visit: Payer: Self-pay

## 2019-10-14 DIAGNOSIS — E2839 Other primary ovarian failure: Secondary | ICD-10-CM

## 2020-01-04 ENCOUNTER — Other Ambulatory Visit: Payer: Self-pay | Admitting: Internal Medicine

## 2020-01-04 DIAGNOSIS — Z1231 Encounter for screening mammogram for malignant neoplasm of breast: Secondary | ICD-10-CM

## 2020-01-19 DIAGNOSIS — I10 Essential (primary) hypertension: Secondary | ICD-10-CM | POA: Diagnosis not present

## 2020-01-19 DIAGNOSIS — E785 Hyperlipidemia, unspecified: Secondary | ICD-10-CM | POA: Diagnosis not present

## 2020-01-19 DIAGNOSIS — E7800 Pure hypercholesterolemia, unspecified: Secondary | ICD-10-CM | POA: Diagnosis not present

## 2020-01-19 DIAGNOSIS — J449 Chronic obstructive pulmonary disease, unspecified: Secondary | ICD-10-CM | POA: Diagnosis not present

## 2020-01-19 DIAGNOSIS — K219 Gastro-esophageal reflux disease without esophagitis: Secondary | ICD-10-CM | POA: Diagnosis not present

## 2020-02-08 DIAGNOSIS — J449 Chronic obstructive pulmonary disease, unspecified: Secondary | ICD-10-CM | POA: Diagnosis not present

## 2020-02-08 DIAGNOSIS — K219 Gastro-esophageal reflux disease without esophagitis: Secondary | ICD-10-CM | POA: Diagnosis not present

## 2020-02-08 DIAGNOSIS — I10 Essential (primary) hypertension: Secondary | ICD-10-CM | POA: Diagnosis not present

## 2020-02-08 DIAGNOSIS — E7800 Pure hypercholesterolemia, unspecified: Secondary | ICD-10-CM | POA: Diagnosis not present

## 2020-02-08 DIAGNOSIS — F17210 Nicotine dependence, cigarettes, uncomplicated: Secondary | ICD-10-CM | POA: Diagnosis not present

## 2020-02-08 DIAGNOSIS — Z23 Encounter for immunization: Secondary | ICD-10-CM | POA: Diagnosis not present

## 2020-02-16 ENCOUNTER — Ambulatory Visit: Payer: Medicare Other

## 2020-02-19 DIAGNOSIS — H40013 Open angle with borderline findings, low risk, bilateral: Secondary | ICD-10-CM | POA: Diagnosis not present

## 2020-02-19 DIAGNOSIS — H25813 Combined forms of age-related cataract, bilateral: Secondary | ICD-10-CM | POA: Diagnosis not present

## 2020-02-19 DIAGNOSIS — H1045 Other chronic allergic conjunctivitis: Secondary | ICD-10-CM | POA: Diagnosis not present

## 2020-02-19 DIAGNOSIS — H35033 Hypertensive retinopathy, bilateral: Secondary | ICD-10-CM | POA: Diagnosis not present

## 2020-02-19 DIAGNOSIS — H02831 Dermatochalasis of right upper eyelid: Secondary | ICD-10-CM | POA: Diagnosis not present

## 2020-02-19 DIAGNOSIS — H02834 Dermatochalasis of left upper eyelid: Secondary | ICD-10-CM | POA: Diagnosis not present

## 2020-03-17 DIAGNOSIS — E785 Hyperlipidemia, unspecified: Secondary | ICD-10-CM | POA: Diagnosis not present

## 2020-03-17 DIAGNOSIS — I10 Essential (primary) hypertension: Secondary | ICD-10-CM | POA: Diagnosis not present

## 2020-03-17 DIAGNOSIS — K219 Gastro-esophageal reflux disease without esophagitis: Secondary | ICD-10-CM | POA: Diagnosis not present

## 2020-03-17 DIAGNOSIS — E7800 Pure hypercholesterolemia, unspecified: Secondary | ICD-10-CM | POA: Diagnosis not present

## 2020-03-17 DIAGNOSIS — J449 Chronic obstructive pulmonary disease, unspecified: Secondary | ICD-10-CM | POA: Diagnosis not present

## 2020-03-23 ENCOUNTER — Ambulatory Visit: Payer: Medicare Other

## 2020-04-21 DIAGNOSIS — I10 Essential (primary) hypertension: Secondary | ICD-10-CM | POA: Diagnosis not present

## 2020-04-21 DIAGNOSIS — J449 Chronic obstructive pulmonary disease, unspecified: Secondary | ICD-10-CM | POA: Diagnosis not present

## 2020-04-21 DIAGNOSIS — E785 Hyperlipidemia, unspecified: Secondary | ICD-10-CM | POA: Diagnosis not present

## 2020-04-21 DIAGNOSIS — E7800 Pure hypercholesterolemia, unspecified: Secondary | ICD-10-CM | POA: Diagnosis not present

## 2020-04-21 DIAGNOSIS — K219 Gastro-esophageal reflux disease without esophagitis: Secondary | ICD-10-CM | POA: Diagnosis not present

## 2020-05-18 ENCOUNTER — Ambulatory Visit
Admission: RE | Admit: 2020-05-18 | Discharge: 2020-05-18 | Disposition: A | Discharge status: Home / Self Care | Payer: Medicare Other | Source: Ambulatory Visit | Attending: Internal Medicine | Admitting: Internal Medicine

## 2020-05-18 ENCOUNTER — Other Ambulatory Visit: Payer: Self-pay

## 2020-05-18 DIAGNOSIS — Z1231 Encounter for screening mammogram for malignant neoplasm of breast: Secondary | ICD-10-CM

## 2020-05-19 ENCOUNTER — Other Ambulatory Visit: Payer: Self-pay | Admitting: Internal Medicine

## 2020-05-19 DIAGNOSIS — R928 Other abnormal and inconclusive findings on diagnostic imaging of breast: Secondary | ICD-10-CM

## 2020-06-07 DIAGNOSIS — I10 Essential (primary) hypertension: Secondary | ICD-10-CM | POA: Diagnosis not present

## 2020-06-07 DIAGNOSIS — E7800 Pure hypercholesterolemia, unspecified: Secondary | ICD-10-CM | POA: Diagnosis not present

## 2020-06-07 DIAGNOSIS — E785 Hyperlipidemia, unspecified: Secondary | ICD-10-CM | POA: Diagnosis not present

## 2020-06-07 DIAGNOSIS — K219 Gastro-esophageal reflux disease without esophagitis: Secondary | ICD-10-CM | POA: Diagnosis not present

## 2020-06-07 DIAGNOSIS — J449 Chronic obstructive pulmonary disease, unspecified: Secondary | ICD-10-CM | POA: Diagnosis not present

## 2020-06-10 ENCOUNTER — Ambulatory Visit
Admission: RE | Admit: 2020-06-10 | Discharge: 2020-06-10 | Disposition: A | Discharge status: Home / Self Care | Payer: Medicare Other | Source: Ambulatory Visit | Attending: Internal Medicine | Admitting: Internal Medicine

## 2020-06-10 ENCOUNTER — Other Ambulatory Visit: Payer: Self-pay

## 2020-06-10 DIAGNOSIS — N6001 Solitary cyst of right breast: Secondary | ICD-10-CM | POA: Diagnosis not present

## 2020-06-10 DIAGNOSIS — R928 Other abnormal and inconclusive findings on diagnostic imaging of breast: Secondary | ICD-10-CM | POA: Diagnosis not present

## 2020-07-15 DIAGNOSIS — E7800 Pure hypercholesterolemia, unspecified: Secondary | ICD-10-CM | POA: Diagnosis not present

## 2020-07-15 DIAGNOSIS — K219 Gastro-esophageal reflux disease without esophagitis: Secondary | ICD-10-CM | POA: Diagnosis not present

## 2020-07-15 DIAGNOSIS — J449 Chronic obstructive pulmonary disease, unspecified: Secondary | ICD-10-CM | POA: Diagnosis not present

## 2020-07-15 DIAGNOSIS — E785 Hyperlipidemia, unspecified: Secondary | ICD-10-CM | POA: Diagnosis not present

## 2020-07-15 DIAGNOSIS — I10 Essential (primary) hypertension: Secondary | ICD-10-CM | POA: Diagnosis not present

## 2020-08-16 DIAGNOSIS — D696 Thrombocytopenia, unspecified: Secondary | ICD-10-CM | POA: Diagnosis not present

## 2020-08-16 DIAGNOSIS — F17210 Nicotine dependence, cigarettes, uncomplicated: Secondary | ICD-10-CM | POA: Diagnosis not present

## 2020-08-16 DIAGNOSIS — J449 Chronic obstructive pulmonary disease, unspecified: Secondary | ICD-10-CM | POA: Diagnosis not present

## 2020-08-16 DIAGNOSIS — K219 Gastro-esophageal reflux disease without esophagitis: Secondary | ICD-10-CM | POA: Diagnosis not present

## 2020-08-16 DIAGNOSIS — Z1389 Encounter for screening for other disorder: Secondary | ICD-10-CM | POA: Diagnosis not present

## 2020-08-16 DIAGNOSIS — E7800 Pure hypercholesterolemia, unspecified: Secondary | ICD-10-CM | POA: Diagnosis not present

## 2020-08-16 DIAGNOSIS — Z0000 Encounter for general adult medical examination without abnormal findings: Secondary | ICD-10-CM | POA: Diagnosis not present

## 2020-08-16 DIAGNOSIS — I10 Essential (primary) hypertension: Secondary | ICD-10-CM | POA: Diagnosis not present

## 2020-08-16 DIAGNOSIS — R7309 Other abnormal glucose: Secondary | ICD-10-CM | POA: Diagnosis not present

## 2020-12-06 DIAGNOSIS — J449 Chronic obstructive pulmonary disease, unspecified: Secondary | ICD-10-CM | POA: Diagnosis not present

## 2020-12-06 DIAGNOSIS — I10 Essential (primary) hypertension: Secondary | ICD-10-CM | POA: Diagnosis not present

## 2020-12-06 DIAGNOSIS — E7800 Pure hypercholesterolemia, unspecified: Secondary | ICD-10-CM | POA: Diagnosis not present

## 2020-12-06 DIAGNOSIS — K219 Gastro-esophageal reflux disease without esophagitis: Secondary | ICD-10-CM | POA: Diagnosis not present

## 2020-12-06 DIAGNOSIS — E785 Hyperlipidemia, unspecified: Secondary | ICD-10-CM | POA: Diagnosis not present

## 2021-02-21 DIAGNOSIS — E785 Hyperlipidemia, unspecified: Secondary | ICD-10-CM | POA: Diagnosis not present

## 2021-02-21 DIAGNOSIS — I10 Essential (primary) hypertension: Secondary | ICD-10-CM | POA: Diagnosis not present

## 2021-02-23 DIAGNOSIS — H25813 Combined forms of age-related cataract, bilateral: Secondary | ICD-10-CM | POA: Diagnosis not present

## 2021-02-23 DIAGNOSIS — H02831 Dermatochalasis of right upper eyelid: Secondary | ICD-10-CM | POA: Diagnosis not present

## 2021-02-23 DIAGNOSIS — H1045 Other chronic allergic conjunctivitis: Secondary | ICD-10-CM | POA: Diagnosis not present

## 2021-02-23 DIAGNOSIS — H524 Presbyopia: Secondary | ICD-10-CM | POA: Diagnosis not present

## 2021-02-23 DIAGNOSIS — H5203 Hypermetropia, bilateral: Secondary | ICD-10-CM | POA: Diagnosis not present

## 2021-02-23 DIAGNOSIS — H02834 Dermatochalasis of left upper eyelid: Secondary | ICD-10-CM | POA: Diagnosis not present

## 2021-02-23 DIAGNOSIS — H40013 Open angle with borderline findings, low risk, bilateral: Secondary | ICD-10-CM | POA: Diagnosis not present

## 2021-02-23 DIAGNOSIS — H35033 Hypertensive retinopathy, bilateral: Secondary | ICD-10-CM | POA: Diagnosis not present

## 2021-02-23 DIAGNOSIS — H52223 Regular astigmatism, bilateral: Secondary | ICD-10-CM | POA: Diagnosis not present

## 2021-02-28 DIAGNOSIS — F17210 Nicotine dependence, cigarettes, uncomplicated: Secondary | ICD-10-CM | POA: Diagnosis not present

## 2021-02-28 DIAGNOSIS — E7800 Pure hypercholesterolemia, unspecified: Secondary | ICD-10-CM | POA: Diagnosis not present

## 2021-02-28 DIAGNOSIS — J449 Chronic obstructive pulmonary disease, unspecified: Secondary | ICD-10-CM | POA: Diagnosis not present

## 2021-02-28 DIAGNOSIS — K219 Gastro-esophageal reflux disease without esophagitis: Secondary | ICD-10-CM | POA: Diagnosis not present

## 2021-02-28 DIAGNOSIS — I700 Atherosclerosis of aorta: Secondary | ICD-10-CM | POA: Diagnosis not present

## 2021-02-28 DIAGNOSIS — I10 Essential (primary) hypertension: Secondary | ICD-10-CM | POA: Diagnosis not present

## 2021-05-23 DIAGNOSIS — J449 Chronic obstructive pulmonary disease, unspecified: Secondary | ICD-10-CM | POA: Diagnosis not present

## 2021-05-23 DIAGNOSIS — E785 Hyperlipidemia, unspecified: Secondary | ICD-10-CM | POA: Diagnosis not present

## 2021-05-23 DIAGNOSIS — K219 Gastro-esophageal reflux disease without esophagitis: Secondary | ICD-10-CM | POA: Diagnosis not present

## 2021-05-23 DIAGNOSIS — I10 Essential (primary) hypertension: Secondary | ICD-10-CM | POA: Diagnosis not present

## 2021-08-17 DIAGNOSIS — K635 Polyp of colon: Secondary | ICD-10-CM | POA: Diagnosis not present

## 2021-08-17 DIAGNOSIS — F17210 Nicotine dependence, cigarettes, uncomplicated: Secondary | ICD-10-CM | POA: Diagnosis not present

## 2021-08-17 DIAGNOSIS — I10 Essential (primary) hypertension: Secondary | ICD-10-CM | POA: Diagnosis not present

## 2021-08-17 DIAGNOSIS — I700 Atherosclerosis of aorta: Secondary | ICD-10-CM | POA: Diagnosis not present

## 2021-08-17 DIAGNOSIS — M8580 Other specified disorders of bone density and structure, unspecified site: Secondary | ICD-10-CM | POA: Diagnosis not present

## 2021-08-17 DIAGNOSIS — K219 Gastro-esophageal reflux disease without esophagitis: Secondary | ICD-10-CM | POA: Diagnosis not present

## 2021-08-17 DIAGNOSIS — Z5181 Encounter for therapeutic drug level monitoring: Secondary | ICD-10-CM | POA: Diagnosis not present

## 2021-08-17 DIAGNOSIS — J449 Chronic obstructive pulmonary disease, unspecified: Secondary | ICD-10-CM | POA: Diagnosis not present

## 2021-08-17 DIAGNOSIS — R7309 Other abnormal glucose: Secondary | ICD-10-CM | POA: Diagnosis not present

## 2021-08-17 DIAGNOSIS — E785 Hyperlipidemia, unspecified: Secondary | ICD-10-CM | POA: Diagnosis not present

## 2021-08-17 DIAGNOSIS — Z0000 Encounter for general adult medical examination without abnormal findings: Secondary | ICD-10-CM | POA: Diagnosis not present

## 2021-08-18 ENCOUNTER — Other Ambulatory Visit: Payer: Self-pay | Admitting: Internal Medicine

## 2021-08-18 DIAGNOSIS — Z1231 Encounter for screening mammogram for malignant neoplasm of breast: Secondary | ICD-10-CM

## 2021-08-23 DIAGNOSIS — J449 Chronic obstructive pulmonary disease, unspecified: Secondary | ICD-10-CM | POA: Diagnosis not present

## 2021-08-23 DIAGNOSIS — K219 Gastro-esophageal reflux disease without esophagitis: Secondary | ICD-10-CM | POA: Diagnosis not present

## 2021-08-23 DIAGNOSIS — E785 Hyperlipidemia, unspecified: Secondary | ICD-10-CM | POA: Diagnosis not present

## 2021-08-23 DIAGNOSIS — I10 Essential (primary) hypertension: Secondary | ICD-10-CM | POA: Diagnosis not present

## 2021-09-26 ENCOUNTER — Ambulatory Visit
Admission: RE | Admit: 2021-09-26 | Discharge: 2021-09-26 | Disposition: A | Discharge status: Home / Self Care | Payer: Medicare Other | Source: Ambulatory Visit | Attending: Internal Medicine | Admitting: Internal Medicine

## 2021-09-26 DIAGNOSIS — Z1231 Encounter for screening mammogram for malignant neoplasm of breast: Secondary | ICD-10-CM | POA: Diagnosis not present

## 2021-12-13 DIAGNOSIS — D124 Benign neoplasm of descending colon: Secondary | ICD-10-CM | POA: Diagnosis not present

## 2021-12-13 DIAGNOSIS — Z09 Encounter for follow-up examination after completed treatment for conditions other than malignant neoplasm: Secondary | ICD-10-CM | POA: Diagnosis not present

## 2021-12-13 DIAGNOSIS — D125 Benign neoplasm of sigmoid colon: Secondary | ICD-10-CM | POA: Diagnosis not present

## 2021-12-13 DIAGNOSIS — K648 Other hemorrhoids: Secondary | ICD-10-CM | POA: Diagnosis not present

## 2021-12-13 DIAGNOSIS — D123 Benign neoplasm of transverse colon: Secondary | ICD-10-CM | POA: Diagnosis not present

## 2021-12-13 DIAGNOSIS — Z86010 Personal history of colonic polyps: Secondary | ICD-10-CM | POA: Diagnosis not present

## 2021-12-13 DIAGNOSIS — K6289 Other specified diseases of anus and rectum: Secondary | ICD-10-CM | POA: Diagnosis not present

## 2021-12-15 DIAGNOSIS — D123 Benign neoplasm of transverse colon: Secondary | ICD-10-CM | POA: Diagnosis not present

## 2021-12-15 DIAGNOSIS — D124 Benign neoplasm of descending colon: Secondary | ICD-10-CM | POA: Diagnosis not present

## 2021-12-15 DIAGNOSIS — D125 Benign neoplasm of sigmoid colon: Secondary | ICD-10-CM | POA: Diagnosis not present

## 2022-02-16 DIAGNOSIS — E785 Hyperlipidemia, unspecified: Secondary | ICD-10-CM | POA: Diagnosis not present

## 2022-02-16 DIAGNOSIS — J449 Chronic obstructive pulmonary disease, unspecified: Secondary | ICD-10-CM | POA: Diagnosis not present

## 2022-02-16 DIAGNOSIS — K219 Gastro-esophageal reflux disease without esophagitis: Secondary | ICD-10-CM | POA: Diagnosis not present

## 2022-02-16 DIAGNOSIS — I10 Essential (primary) hypertension: Secondary | ICD-10-CM | POA: Diagnosis not present

## 2022-03-01 DIAGNOSIS — H1045 Other chronic allergic conjunctivitis: Secondary | ICD-10-CM | POA: Diagnosis not present

## 2022-03-01 DIAGNOSIS — H35033 Hypertensive retinopathy, bilateral: Secondary | ICD-10-CM | POA: Diagnosis not present

## 2022-03-01 DIAGNOSIS — H02831 Dermatochalasis of right upper eyelid: Secondary | ICD-10-CM | POA: Diagnosis not present

## 2022-03-01 DIAGNOSIS — H40013 Open angle with borderline findings, low risk, bilateral: Secondary | ICD-10-CM | POA: Diagnosis not present

## 2022-03-01 DIAGNOSIS — H02834 Dermatochalasis of left upper eyelid: Secondary | ICD-10-CM | POA: Diagnosis not present

## 2022-03-01 DIAGNOSIS — H25813 Combined forms of age-related cataract, bilateral: Secondary | ICD-10-CM | POA: Diagnosis not present

## 2022-03-13 DIAGNOSIS — J449 Chronic obstructive pulmonary disease, unspecified: Secondary | ICD-10-CM | POA: Diagnosis not present

## 2022-03-13 DIAGNOSIS — F17210 Nicotine dependence, cigarettes, uncomplicated: Secondary | ICD-10-CM | POA: Diagnosis not present

## 2022-03-13 DIAGNOSIS — I700 Atherosclerosis of aorta: Secondary | ICD-10-CM | POA: Diagnosis not present

## 2022-03-13 DIAGNOSIS — E785 Hyperlipidemia, unspecified: Secondary | ICD-10-CM | POA: Diagnosis not present

## 2022-03-13 DIAGNOSIS — J439 Emphysema, unspecified: Secondary | ICD-10-CM | POA: Diagnosis not present

## 2022-03-13 DIAGNOSIS — I10 Essential (primary) hypertension: Secondary | ICD-10-CM | POA: Diagnosis not present

## 2022-04-20 DIAGNOSIS — I10 Essential (primary) hypertension: Secondary | ICD-10-CM | POA: Diagnosis not present

## 2022-04-20 DIAGNOSIS — D696 Thrombocytopenia, unspecified: Secondary | ICD-10-CM | POA: Diagnosis not present

## 2022-04-20 DIAGNOSIS — F17210 Nicotine dependence, cigarettes, uncomplicated: Secondary | ICD-10-CM | POA: Diagnosis not present

## 2022-06-11 ENCOUNTER — Other Ambulatory Visit: Payer: Self-pay

## 2022-06-11 ENCOUNTER — Encounter (HOSPITAL_BASED_OUTPATIENT_CLINIC_OR_DEPARTMENT_OTHER): Payer: Self-pay

## 2022-06-11 ENCOUNTER — Emergency Department (HOSPITAL_BASED_OUTPATIENT_CLINIC_OR_DEPARTMENT_OTHER): Payer: 59 | Admitting: Radiology

## 2022-06-11 DIAGNOSIS — M7989 Other specified soft tissue disorders: Secondary | ICD-10-CM | POA: Diagnosis not present

## 2022-06-11 DIAGNOSIS — S63502A Unspecified sprain of left wrist, initial encounter: Secondary | ICD-10-CM | POA: Diagnosis not present

## 2022-06-11 DIAGNOSIS — S6992XA Unspecified injury of left wrist, hand and finger(s), initial encounter: Secondary | ICD-10-CM | POA: Diagnosis present

## 2022-06-11 DIAGNOSIS — W19XXXA Unspecified fall, initial encounter: Secondary | ICD-10-CM | POA: Insufficient documentation

## 2022-06-11 NOTE — ED Provider Triage Note (Signed)
Emergency Medicine Provider Triage Evaluation Note  Tiffany Frye , a 67 y.o. female  was evaluated in triage.  Pt complains of mechanical fall with L wrist pain. No head injury. No LOC. No anticoagulants. No elbow or hand pain.  Review of Systems  Positive: See HPI Negative: See HPI  Physical Exam  BP (!) 181/91 (BP Location: Right Arm)   Pulse 77   Temp 99.6 F (37.6 C) (Oral)   Resp 18   Ht 5\' 2"  (1.575 m)   Wt 86.2 kg   SpO2 100%   BMI 34.75 kg/m  Gen:   Awake, no distress   Resp:  Normal effort  MSK:   Moves extremities without difficulty  Other:  Left lateral wrist tenderness with questionable deformity, NVID, elbow, hand, and all digits nontender  Medical Decision Making  Medically screening exam initiated at 10:09 PM.  Appropriate orders placed.  Claudio Gaillard was informed that the remainder of the evaluation will be completed by another provider, this initial triage assessment does not replace that evaluation, and the importance of remaining in the ED until their evaluation is complete.     Tonette Lederer, PA-C 06/11/22 2210

## 2022-06-11 NOTE — ED Triage Notes (Signed)
Patient here POV from Home.  Endorses Fall today this PM. Sitting in Chair when Seat broke.  No head Injury. Pain to left Distal Forearm/Wrist Area.  NAD Noted during Triage. A&Ox4. Gcs 15. Ambulatory.

## 2022-06-12 ENCOUNTER — Emergency Department (HOSPITAL_BASED_OUTPATIENT_CLINIC_OR_DEPARTMENT_OTHER)
Admission: EM | Admit: 2022-06-12 | Discharge: 2022-06-12 | Disposition: A | Discharge status: Home / Self Care | Payer: 59 | Attending: Emergency Medicine | Admitting: Emergency Medicine

## 2022-06-12 DIAGNOSIS — S63502A Unspecified sprain of left wrist, initial encounter: Secondary | ICD-10-CM

## 2022-06-12 MED ORDER — OXYCODONE-ACETAMINOPHEN 5-325 MG PO TABS
2.0000 | ORAL_TABLET | Freq: Once | ORAL | Status: AC
  Administered 2022-06-12: 2 via ORAL
  Filled 2022-06-12: qty 2

## 2022-06-12 MED ORDER — ONDANSETRON 4 MG PO TBDP
4.0000 mg | ORAL_TABLET | Freq: Once | ORAL | Status: AC
  Administered 2022-06-12: 4 mg via ORAL
  Filled 2022-06-12: qty 1

## 2022-06-12 NOTE — ED Provider Notes (Signed)
Tiffany Frye EMERGENCY DEPARTMENT AT [facility] Provider Note   CSN: [id number] Arrival date & time: 06/11/22  2125     History  Chief Complaint  Patient presents with   Tiffany Frye    Tiffany Frye is a 67 y.o. female who presents to the ED complaining of a mechanical fall.  She states that she fell on an outstretched hand and complains of left wrist pain.  She denies hitting her head or LOC.  She does not take anticoagulants.  She denies elbow or hand pain.  No previous injuries to this wrist. No paresthesias. No shoulder pain.       Home Medications Prior to Admission medications   Medication Sig Start Date End Date Taking? Authorizing Provider  feeding supplement, ENSURE ENLIVE, (ENSURE ENLIVE) LIQD Take 237 mLs by mouth 2 (two) times daily between meals. 03/13/18   Albertine Grates, MD  folic acid (FOLVITE) 1 MG tablet Take 1 tablet (1 mg total) by mouth daily. 03/14/18   Albertine Grates, MD  Multiple Vitamin (MULTIVITAMIN WITH MINERALS) TABS tablet Take 1 tablet by mouth daily. 03/14/18   Albertine Grates, MD  pantoprazole (PROTONIX) 40 MG tablet Take 40 mg by mouth daily. 07/18/16   [provider]  simvastatin (ZOCOR) 20 MG tablet Take 20 mg by mouth every evening.    [provider]  thiamine (VITAMIN B-1) 100 MG tablet Take 1 tablet (100 mg total) by mouth daily. 03/21/18   Albertine Grates, MD      Allergies    Penicillins    Review of Systems   Review of Systems  All other systems reviewed and are negative.   Physical Exam Updated Vital Signs BP (!) 175/94   Pulse 80   Temp 99.6 F (37.6 C) (Oral)   Resp 17   Ht 5\' 2"  (1.575 m)   Wt 86.2 kg   SpO2 100%   BMI 34.75 kg/m  Physical Exam Vitals and nursing note reviewed.  Constitutional:      General: She is not in acute distress.    Appearance: Normal appearance.  HENT:     Head: Normocephalic and atraumatic.     Mouth/Throat:     Mouth: Mucous membranes are moist.  Eyes:     Conjunctiva/sclera: Conjunctivae normal.   Cardiovascular:     Rate and Rhythm: Normal rate and regular rhythm.     Heart sounds: No murmur heard. Pulmonary:     Effort: Pulmonary effort is normal.     Breath sounds: Normal breath sounds.  Abdominal:     General: Abdomen is flat.     Palpations: Abdomen is soft.  Musculoskeletal:     Cervical back: Normal range of motion and neck supple. No rigidity.     Comments: Left lateral wrist tenderness with questionable deformity, NVID, elbow, hand, and all digits nontender with full range of motion, no tenderness over the shoulder, soft compartments of left upper extremity, 2+ radial pulse  Skin:    General: Skin is warm and dry.     Capillary Refill: Capillary refill takes less than 2 seconds.  Neurological:     Mental Status: She is alert. Mental status is at baseline.  Psychiatric:        Behavior: Behavior normal.     ED Results / Procedures / Treatments   Labs (all labs ordered are listed, but only abnormal results are displayed) Labs Reviewed - No data to display  EKG None  Radiology DG Forearm Left  Result Date:  06/11/2022 CLINICAL DATA:  Status post fall. EXAM: LEFT FOREARM - 2 VIEW COMPARISON:  None Available. FINDINGS: There is a small, chronic appearing cortical deformity is seen along the lateral aspect of the distal left radius. There is no evidence of dislocation. A 5 mm x 3 mm benign-appearing cystic lesion is seen on the lateral view within the expected region of the triquetrum bone. Mild to moderate severity soft tissue swelling is seen along the dorsal aspect of the distal left forearm. IMPRESSION: Mild to moderate severity soft tissue swelling along the dorsal aspect of the distal left forearm, without evidence of an acute fracture. Electronically Signed   By: Aram Candela M.D.   On: 06/11/2022 22:39    Procedures Procedures    Medications Ordered in ED Medications  oxyCODONE-acetaminophen (PERCOCET/ROXICET) 5-325 MG per tablet 2 tablet (2 tablets  Oral Given 06/12/22 0046)  ondansetron (ZOFRAN-ODT) disintegrating tablet 4 mg (4 mg Oral Given 06/12/22 0046)    ED Course/ Medical Decision Making/ A&P                             Medical Decision Making Amount and/or Complexity of Data Reviewed Radiology: ordered. Decision-making details documented in ED Course.   Medical Decision Making:   Tiffany Frye is a 67 y.o. female who presented to the ED today with wrist pain detailed above.    Additional history discussed with patient's family/caregivers.  Patient's presentation is complicated by their history of advanced age, fall.  Complete initial physical exam performed, notably the patient was in NAD. Left lateral wrist tenderness with questionable deformity, NVID, elbow, hand, and all digits nontender with full range of motion, no tenderness over the shoulder, soft compartments of left upper extremity, 2+ radial pulse.    Reviewed and confirmed nursing documentation for past medical history, family history, social history.    Initial Assessment:   With the patient's presentation, differential diagnosis includes but is not limited to sprain, strain, fracture, dislocation, compartment syndrome, head injury, syncope.  This is most consistent with an acute complicated illness  Initial Plan:  XR to evaluate bony pathology Symptomatic management Objective evaluation as below reviewed   Initial Study Results:   Radiology:  All images reviewed independently. Agree with radiology report at this time.   DG Forearm Left  Result Date: 06/11/2022 CLINICAL DATA:  Status post fall. EXAM: LEFT FOREARM - 2 VIEW COMPARISON:  None Available. FINDINGS: There is a small, chronic appearing cortical deformity is seen along the lateral aspect of the distal left radius. There is no evidence of dislocation. A 5 mm x 3 mm benign-appearing cystic lesion is seen on the lateral view within the expected region of the triquetrum bone. Mild to moderate severity soft  tissue swelling is seen along the dorsal aspect of the distal left forearm. IMPRESSION: Mild to moderate severity soft tissue swelling along the dorsal aspect of the distal left forearm, without evidence of an acute fracture. Electronically Signed   By: Aram Candela M.D.   On: 06/11/2022 22:39      Final Assessment and Plan:   67 year old female presents to the ED due to [facility] injury following mechanical fall with left wrist pain.  Neurovascularly intact.  Possible deformity.  Associated soft tissue swelling but no open wounds.  No previous injury to this hand.  No head injury, LOC. XR without acute bony abnormalities. Soft compartments. Suspect sprain.  Brace provided and will have patient closely  follow-up with orthopedics for continued symptoms.  Patient and family expressed understanding of plan. Strict ED return precautions given, all questions answered, and stable for discharge.    Clinical Impression:  1. Sprain of left wrist, initial encounter      Discharge           Final Clinical Impression(s) / ED Diagnoses Final diagnoses:  Sprain of left wrist, initial encounter    Rx / DC Orders ED Discharge Orders     None         Richardson Dopp 06/13/22 Jerrilyn Cairo, MD 06/18/22 [phone]

## 2022-06-12 NOTE — ED Notes (Signed)
RN reviewed discharge instructions with pt. Pt verbalized understanding and had no further questions. VSS upon discharge.  

## 2022-06-12 NOTE — Discharge Instructions (Signed)
Thank you for letting us care for you today.  Your x-ray was normal.  We placed you in a wrist brace to help with your pain and swelling.  Please follow-up with your PCP for any continued symptoms.  If you continue to have issues with your wrist, I also recommend following up with orthopedics which I provided.  For any new injury or worsening symptoms, please return to the nearest ED for reevaluation.

## 2022-08-13 ENCOUNTER — Ambulatory Visit
Admission: EM | Admit: 2022-08-13 | Discharge: 2022-08-13 | Disposition: A | Discharge status: Home / Self Care | Payer: 59

## 2022-08-13 ENCOUNTER — Ambulatory Visit: Payer: 59

## 2022-08-13 DIAGNOSIS — I10 Essential (primary) hypertension: Secondary | ICD-10-CM

## 2022-08-13 DIAGNOSIS — S82891A Other fracture of right lower leg, initial encounter for closed fracture: Secondary | ICD-10-CM | POA: Diagnosis not present

## 2022-08-13 DIAGNOSIS — S99911A Unspecified injury of right ankle, initial encounter: Secondary | ICD-10-CM | POA: Diagnosis not present

## 2022-08-13 MED ORDER — NAPROXEN 375 MG PO TABS: 375.0000 mg | ORAL_TABLET | Freq: Two times a day (BID) | ORAL | 0 refills | Status: AC

## 2022-08-13 NOTE — Discharge Instructions (Addendum)
Call orthopedic office listed tomorrow morning or go to their walk-in clinic which is open from 8 AM to 8 PM for further evaluation of your ankle fracture.  Wear ankle brace daily until otherwise directed by orthopedic specialist.  I would like for you to elevate your foot when at home and resting and apply ice at least 2-3 times daily in increments of 15 to 20 minutes to reduce swelling.  Start naproxen 375 this evening with food and take twice daily for 7 days to reduce the inflammation which is causing the swelling in your ankle.  Continue to use your walker because I would like for you to remain nonweightbearing on that ankle until otherwise directed by orthopedic specialist.

## 2022-08-13 NOTE — ED Triage Notes (Signed)
Pt reports pain and swelling in right ankle after she slipped and fell when walking on concrete 2 days ago. Ibuprofen gives no relief.

## 2022-08-13 NOTE — ED Provider Notes (Signed)
[facility]    CSN: [id number] Arrival date & time: 08/13/22  1740      History   Chief Complaint Chief Complaint  Patient presents with   Fall   Ankle Injury         HPI Tiffany Frye is a 67 y.o. female.   HPI Patient presents today for evaluation of right ankle swelling after she suffered an injury when she slipped and fell on water 4 days ago. The right ankle continues to swell and she is unable to bear weight on the right foot. She has been using a walker since injury occurred. She has not iced  her ankle and endorse ankle and foot feel tight  due to swelling. She recall slipping and twisting her ankle/foot inwardly.  Currently not taking any OTC medications for pain.  Past Medical History:  Diagnosis Date   Alcohol abuse    Depression    High cholesterol    Hypertension     Patient Active Problem List   Diagnosis Date Noted   Delirium    Hypomagnesemia    Thrombocytopenia (HCC)    Hypokalemia    LFT elevation    Thiamine deficiency    Malnutrition of moderate degree 03/03/2018   Hyponatremia 03/01/2018   Hyperkalemia 03/01/2018   Acute metabolic encephalopathy 03/01/2018   Alcohol abuse    High cholesterol    Hypertension     Past Surgical History:  Procedure Laterality Date   COLONOSCOPY WITH PROPOFOL N/A 09/20/2015   Procedure: COLONOSCOPY WITH PROPOFOL;  Surgeon: Charolett Bumpers, MD;  Location: [facility];  Service: Endoscopy;  Laterality: N/A;   TUBAL LIGATION      OB History   No obstetric history on file.      Home Medications    Prior to Admission medications   Medication Sig Start Date End Date Taking? Authorizing Provider  ibuprofen (ADVIL) 200 MG tablet Take 200 mg by mouth every 6 (six) hours as needed.   Yes [provider]  naproxen (NAPROSYN) 375 MG tablet Take 1 tablet (375 mg total) by mouth 2 (two) times daily for 7 days. 08/13/22 08/20/22 Yes Bing Neighbors, NP  amLODipine (NORVASC) 10 MG tablet Take 10 mg  by mouth daily.    [provider]  atorvastatin (LIPITOR) 10 MG tablet Take 10 mg by mouth every morning.    [provider]  buPROPion (WELLBUTRIN XL) 150 MG 24 hr tablet Take 150 mg by mouth daily.    [provider]  feeding supplement, ENSURE ENLIVE, (ENSURE ENLIVE) LIQD Take 237 mLs by mouth 2 (two) times daily between meals. 03/13/18   Albertine Grates, MD  folic acid (FOLVITE) 1 MG tablet Take 1 tablet (1 mg total) by mouth daily. 03/14/18   Albertine Grates, MD  furosemide (LASIX) 20 MG tablet TAKE ONE TABLET BY MOUTH ONCE daily AS NEEDED    [provider]  losartan (COZAAR) 50 MG tablet Take 50 mg by mouth every morning.    [provider]  Multiple Vitamin (MULTIVITAMIN WITH MINERALS) TABS tablet Take 1 tablet by mouth daily. 03/14/18   Albertine Grates, MD  pantoprazole (PROTONIX) 40 MG tablet Take 40 mg by mouth daily. 07/18/16   [provider]  simvastatin (ZOCOR) 20 MG tablet Take 20 mg by mouth every evening.    [provider]  thiamine (VITAMIN B-1) 100 MG tablet Take 1 tablet (100 mg total) by mouth daily. 03/21/18   Albertine Grates, MD  Family History Family History  Problem Relation Age of Onset   Breast cancer Sister 71    Social History Social History   Tobacco Use   Smoking status: Every Day    Current packs/day: 0.50    Types: Cigarettes   Smokeless tobacco: Never  Substance Use Topics   Alcohol use: Not Currently    Alcohol/week: 168.0 standard drinks of alcohol    Types: 168 Cans of beer per week    Comment: decrease to 2-3 beers per week   Drug use: No     Allergies   Lisinopril and Penicillins   Review of Systems Review of Systems Pertinent negatives listed in HPI  Physical Exam Triage Vital Signs ED Triage Vitals  Encounter Vitals Group     BP 08/13/22 1747 (!) 147/77     Systolic BP Percentile --      Diastolic BP Percentile --      Pulse Rate 08/13/22 1747 76     Resp 08/13/22 1747 20     Temp 08/13/22  1747 98.3 F (36.8 C)     Temp Source 08/13/22 1747 Oral     SpO2 08/13/22 1747 95 %     Weight --      Height --      Head Circumference --      Peak Flow --      Pain Score 08/13/22 1751 10     Pain Loc --      Pain Education --      Exclude from Growth Chart --    No data found.  Updated Vital Signs BP (!) 147/77 (BP Location: Right Arm)   Pulse 76   Temp 98.3 F (36.8 C) (Oral)   Resp 20   SpO2 95%   Visual Acuity Right Eye Distance:   Left Eye Distance:   Bilateral Distance:    Right Eye Near:   Left Eye Near:    Bilateral Near:     Physical Exam Constitutional:      Appearance: She is obese.  HENT:     Head: Normocephalic and atraumatic.  Cardiovascular:     Rate and Rhythm: Normal rate and regular rhythm.  Pulmonary:     Effort: Pulmonary effort is normal.     Breath sounds: Normal breath sounds.  Musculoskeletal:     Cervical back: Normal range of motion and neck supple.     Right ankle: Swelling and deformity present. Tenderness present over the lateral malleolus and medial malleolus. No base of 5th metatarsal or proximal fibula tenderness. Decreased range of motion.     Right Achilles Tendon: Tenderness present. No defects. Thompson's test negative.     Right foot: Swelling present.     Left foot: Normal.  Neurological:     General: No focal deficit present.     Mental Status: She is alert.    UC Treatments / Results  Labs (all labs ordered are listed, but only abnormal results are displayed) Labs Reviewed - No data to display  EKG   Radiology DG Ankle Complete Right  Result Date: 08/13/2022 CLINICAL DATA:  Fall, twisting injury EXAM: RIGHT ANKLE - COMPLETE 3+ VIEW COMPARISON:  None Available. FINDINGS: Diffuse soft tissue swelling. Small avulsed bone fragments off the medial malleolus in the medial calcaneus. Concern for possible avulsed fragment off the lateral hindfoot seen only on the AP view. No fibular abnormality. No subluxation or  dislocation. Joint spaces maintained. IMPRESSION: Small avulsed fragments off the tip of  the medial malleolus, the medial calcaneus and possibly the lateral hindfoot. Diffuse soft tissue swelling. Electronically Signed   By: Charlett Nose M.D.   On: 08/13/2022 18:30    Procedures Procedures (including critical care time)  Medications Ordered in UC Medications - No data to display  Initial Impression / Assessment and Plan / UC Course  I have reviewed the triage vital signs and the nursing notes.  Pertinent labs & imaging results that were available during my care of the patient were reviewed by me and considered in my medical decision making (see chart for details).    Avulsion Fracture, right, non weightbearing with walker, applied Ankle Lace-up, concern for increased risk of falling with cam walker boot. RICE when at home. Ice applications for 15-20 minute increments minimal of 2-3 times per day. Contact [facility] for further evaluation of ankle fracture. Naprosyn 375 mg twice as needed, inflammation, swelling, and pain  Final Clinical Impressions(s) / UC Diagnoses   Final diagnoses:  Avulsion fracture of ankle, right, closed, initial encounter     Discharge Instructions      Call orthopedic office listed tomorrow morning or go to their walk-in clinic which is open from 8 AM to 8 PM for further evaluation of your ankle fracture.  Wear ankle brace daily until otherwise directed by orthopedic specialist.  I would like for you to elevate your foot when at home and resting and apply ice at least 2-3 times daily in increments of 15 to 20 minutes to reduce swelling.  Start naproxen 375 this evening with food and take twice daily for 7 days to reduce the inflammation which is causing the swelling in your ankle.  Continue to use your walker because I would like for you to remain nonweightbearing on that ankle until otherwise directed by orthopedic specialist.   ED Prescriptions      Medication Sig Dispense Auth. Provider   naproxen (NAPROSYN) 375 MG tablet Take 1 tablet (375 mg total) by mouth 2 (two) times daily for 7 days. 14 tablet Bing Neighbors, NP      PDMP not reviewed this encounter.   Bing Neighbors, NP 08/15/22 [phone]

## 2022-08-14 DIAGNOSIS — S8264XA Nondisplaced fracture of lateral malleolus of right fibula, initial encounter for closed fracture: Secondary | ICD-10-CM | POA: Diagnosis not present

## 2022-08-23 DIAGNOSIS — R7303 Prediabetes: Secondary | ICD-10-CM | POA: Diagnosis not present

## 2022-08-23 DIAGNOSIS — I10 Essential (primary) hypertension: Secondary | ICD-10-CM | POA: Diagnosis not present

## 2022-08-23 DIAGNOSIS — J439 Emphysema, unspecified: Secondary | ICD-10-CM | POA: Diagnosis not present

## 2022-08-23 DIAGNOSIS — E785 Hyperlipidemia, unspecified: Secondary | ICD-10-CM | POA: Diagnosis not present

## 2022-08-23 DIAGNOSIS — Z9181 History of falling: Secondary | ICD-10-CM | POA: Diagnosis not present

## 2022-08-23 DIAGNOSIS — F17210 Nicotine dependence, cigarettes, uncomplicated: Secondary | ICD-10-CM | POA: Diagnosis not present

## 2022-08-23 DIAGNOSIS — I700 Atherosclerosis of aorta: Secondary | ICD-10-CM | POA: Diagnosis not present

## 2022-08-23 DIAGNOSIS — Z0000 Encounter for general adult medical examination without abnormal findings: Secondary | ICD-10-CM | POA: Diagnosis not present

## 2022-09-13 DIAGNOSIS — S8264XA Nondisplaced fracture of lateral malleolus of right fibula, initial encounter for closed fracture: Secondary | ICD-10-CM | POA: Diagnosis not present

## 2022-09-21 DIAGNOSIS — M25571 Pain in right ankle and joints of right foot: Secondary | ICD-10-CM | POA: Diagnosis not present

## 2022-11-02 ENCOUNTER — Other Ambulatory Visit: Payer: Self-pay | Admitting: Internal Medicine

## 2022-11-02 DIAGNOSIS — Z0000 Encounter for general adult medical examination without abnormal findings: Secondary | ICD-10-CM

## 2022-11-15 DIAGNOSIS — S8264XA Nondisplaced fracture of lateral malleolus of right fibula, initial encounter for closed fracture: Secondary | ICD-10-CM | POA: Diagnosis not present

## 2022-11-28 ENCOUNTER — Ambulatory Visit
Admission: RE | Admit: 2022-11-28 | Discharge: 2022-11-28 | Disposition: A | Discharge status: Home / Self Care | Payer: 59 | Source: Ambulatory Visit | Attending: Internal Medicine | Admitting: Internal Medicine

## 2022-11-28 DIAGNOSIS — Z1231 Encounter for screening mammogram for malignant neoplasm of breast: Secondary | ICD-10-CM | POA: Diagnosis not present

## 2022-11-28 DIAGNOSIS — Z0000 Encounter for general adult medical examination without abnormal findings: Secondary | ICD-10-CM

## 2022-12-26 ENCOUNTER — Encounter: Payer: Self-pay | Admitting: Internal Medicine

## 2022-12-26 ENCOUNTER — Other Ambulatory Visit: Payer: Self-pay | Admitting: Internal Medicine

## 2022-12-26 DIAGNOSIS — F17210 Nicotine dependence, cigarettes, uncomplicated: Secondary | ICD-10-CM | POA: Diagnosis not present

## 2022-12-26 DIAGNOSIS — J449 Chronic obstructive pulmonary disease, unspecified: Secondary | ICD-10-CM | POA: Diagnosis not present

## 2022-12-26 DIAGNOSIS — E785 Hyperlipidemia, unspecified: Secondary | ICD-10-CM | POA: Diagnosis not present

## 2022-12-26 DIAGNOSIS — E538 Deficiency of other specified B group vitamins: Secondary | ICD-10-CM | POA: Diagnosis not present

## 2022-12-26 DIAGNOSIS — D649 Anemia, unspecified: Secondary | ICD-10-CM | POA: Diagnosis not present

## 2022-12-26 DIAGNOSIS — I700 Atherosclerosis of aorta: Secondary | ICD-10-CM | POA: Diagnosis not present

## 2022-12-26 DIAGNOSIS — I10 Essential (primary) hypertension: Secondary | ICD-10-CM | POA: Diagnosis not present

## 2023-01-17 ENCOUNTER — Ambulatory Visit
Admission: RE | Admit: 2023-01-17 | Discharge: 2023-01-17 | Disposition: A | Discharge status: Home / Self Care | Payer: 59 | Source: Ambulatory Visit | Attending: Internal Medicine | Admitting: Internal Medicine

## 2023-01-17 DIAGNOSIS — F17210 Nicotine dependence, cigarettes, uncomplicated: Secondary | ICD-10-CM

## 2023-01-25 ENCOUNTER — Other Ambulatory Visit (HOSPITAL_COMMUNITY): Payer: Self-pay | Admitting: Internal Medicine

## 2023-01-25 DIAGNOSIS — I2510 Atherosclerotic heart disease of native coronary artery without angina pectoris: Secondary | ICD-10-CM

## 2023-02-12 ENCOUNTER — Ambulatory Visit (HOSPITAL_COMMUNITY)
Admission: RE | Admit: 2023-02-12 | Discharge: 2023-02-12 | Disposition: A | Discharge status: Home / Self Care | Payer: Self-pay | Source: Ambulatory Visit | Attending: Internal Medicine | Admitting: Internal Medicine

## 2023-02-12 DIAGNOSIS — I2510 Atherosclerotic heart disease of native coronary artery without angina pectoris: Secondary | ICD-10-CM | POA: Insufficient documentation

## 2023-03-06 DIAGNOSIS — H02834 Dermatochalasis of left upper eyelid: Secondary | ICD-10-CM | POA: Diagnosis not present

## 2023-03-06 DIAGNOSIS — H02831 Dermatochalasis of right upper eyelid: Secondary | ICD-10-CM | POA: Diagnosis not present

## 2023-03-06 DIAGNOSIS — H25813 Combined forms of age-related cataract, bilateral: Secondary | ICD-10-CM | POA: Diagnosis not present

## 2023-03-06 DIAGNOSIS — H35033 Hypertensive retinopathy, bilateral: Secondary | ICD-10-CM | POA: Diagnosis not present

## 2023-03-06 DIAGNOSIS — H1045 Other chronic allergic conjunctivitis: Secondary | ICD-10-CM | POA: Diagnosis not present

## 2023-03-06 DIAGNOSIS — H40013 Open angle with borderline findings, low risk, bilateral: Secondary | ICD-10-CM | POA: Diagnosis not present

## 2023-03-25 NOTE — Progress Notes (Unsigned)
 Cardiology Office Note:  .   Date:  03/27/2023 ID:  Tiffany Frye, DOB September 29, 1955, MRN [id number] PCP: Georgann Housekeeper, MD [facility] Providers Cardiologist:  None   Patient Profile: .      PMH Coronary artery disease CT calcium score 02/12/23 CAC score 4304 (99th percentile) LM 0, LAD 1637, LCx 865, RCA 1803 Aortic atherosclerosis Hypertension Hyperlipidemia COPD Tobacco dependence       History of Present Illness: .   Tiffany Frye is a pleasant 68 y.o. female  who is here today for new patient consult for elevated coronary calcium score. She is here with her daughter.  She reports CT calcium score was ordered based on calcification seen on lung cancer screening CT.  She reports shortness of breath when performing activities such as unloading groceries or doing housework.  This has been going on for quite a while and will resolve once she has rested for a few minutes. She denies chest pain or palpitations. Unfortunately, she continues to smoke.  She reports bilateral ankle swelling that initially occurred occasionally but has recently increased.  She was given a prescription for Lasix as needed by PCP, but she is taking it daily now.  She admits she does not engage in physical activity very often. She also skips meals for unclear reasons.  Her daughter does most of the cooking for her and reports she eats small portions.  She also gets a daytime meal from [employer].  Reports that BP has been well-controlled.  Family history is significant for her mother who had heart disease but per her report, this was later in life.  She occasionally drinks alcohol and soda but mostly drinks water.  She denies orthopnea, PND, presyncope, or syncope.   Family history: Her family history includes Breast cancer (age of onset: 36) in her sister.  MGF - died in his sleep, presumed to be MI Mother had diabetes  Discussed the use of AI scribe software for clinical note transcription with the patient,  who gave verbal consent to proceed.  ASCVD Risk Score:  ASCVD (Atherosclerotic Cardiovascular Disease) Risk Algorithm including Known ASCVD from AHA/ACC from [email or web address]  on 03/25/2023 ** All calculations should be rechecked by clinician prior to use **  RESULT SUMMARY: 18.2 % Risk of cardiovascular event (coronary or stroke death or non-fatal MI or stroke) in next 10 years.  INPUTS: History of ASCVD --> 0 = No LDL Cholesterol >=190mg /dL (7.82 mmol/L) --> 0 = No Age --> 67 years Diabetes --> 0 = No Sex --> 0 = Female Total Cholesterol --> 164 mg/dL HDL Cholesterol --> 50 mg/dL Systolic Blood Pressure --> 130 mm Hg Treatment for Hypertension --> 1 = Yes Smoker --> 1 = Yes Race --> 1 = White  Diet: Beans, vegetables Likes fish Meat veggie starch - eats small portions Loves water, rare soda ETOH - couple drinks per week  Activity: No regular exercise House work  No results found for: "LIPOA"   ROS: See HPI       Studies Reviewed: Marland Kitchen   EKG Interpretation Date/Time:  Wednesday March 27 2023 14:13:11 EDT Ventricular Rate:  77 PR Interval:  182 QRS Duration:  76 QT Interval:  404 QTC Calculation: 457 R Axis:   54  Text Interpretation: Normal sinus rhythm Nonspecific ST and T wave abnormality When compared with ECG of 28-Feb-2018 22:11, PREVIOUS ECG IS PRESENT Confirmed by Eligha Bridegroom [phone]) on 03/27/2023 2:34:37 PM       Risk Assessment/Calculations:  Physical Exam:   VS: BP 128/76   Pulse 83   Ht 5\' 2"  (1.575 m)   Wt 205 lb 9.6 oz (93.3 kg)   SpO2 100%   BMI 37.60 kg/m   Wt Readings from Last 3 Encounters:  03/27/23 205 lb 9.6 oz (93.3 kg)  06/11/22 190 lb (86.2 kg)  03/10/18 123 lb 10.9 oz (56.1 kg)     GEN: Well nourished, well developed in no acute distress NECK: No JVD; No carotid bruits CARDIAC: RRR, no murmurs, rubs, gallops RESPIRATORY:  Diminished breath sounds bilaterally without rales, wheezing or rhonchi  ABDOMEN: Soft,  non-tender, non-distended EXTREMITIES:  No edema; No deformity     ASSESSMENT AND PLAN: .    CAD: Significantly elevated CAC of 04/11/2002 (99th percentile) with heavy calcification of LAD, LCx, and RCA.  Due to this high score, and additional risk factors of hyperlipidemia, hypertension, and tobacco abuse, we will get cardiac PET/CT for evaluation of ischemia.  She does have chronic shortness of breath but no chest pain or palpitations.  EKG reveals nonspecific ST abnormality.  Recommended that she try walking around her apartment complex for exercise and notify us if she develops concerning cardiac symptoms.  Recommend that she start aspirin 81 mg daily.  Continue atorvastatin.  Shortness of breath: She is having shortness of breath that has been ongoing for some time. She is a longtime smoker and has been diagnosed with COPD.  SOB is not accompanied by chest pain or palpitations. Is having some leg edema as noted below but no orthopnea or PND. Admits she is not very active. We discussed potentially getting echocardiogram for evaluation of heart and valve function but she would like to start with just getting PET/CT for now. She was prescribed Lasix in the past to use as needed for swelling but started taking it daily. I am changing her diuretic therapy to daily hydrochlorothiazide in the place of Lasix.  Low-sodium diet and regular physical activity encouraged.   Hyperlipidemia LDL goal < 70: Lipid panel completed 08/24/2022 with total cholesterol 164, HDL 50, triglycerides 135, and LDL-C 91.  Following lab work 08/2022, she was advised to increase atorvastatin from 10 mg to 40 mg daily.  Her daughter thinks she has had repeat lab testing but I was unable to find that information.  We will reach out to PCP for copies of lipid panel.  Would recommend repeat lipid panel if not done recently.  Leg edema: Bilateral LE edema that has improved on Lasix. Lasix initially prescribed by PCP for as needed use. No  recent renal panel for review.  We will have her start hydrochlorothiazide 25 mg daily and potassium chloride 10 mEq daily and have bmet in 2 weeks.  Use Lasix 20 mg daily only as needed for additional swelling.  Hypertension: BP is well controlled.  We are stopping Lasix 20 mg daily and starting hydrochlorothiazide 25 mg daily along with potassium supplement.  Potassium was 4.2 on 08/23/2022.  Will have her return for BMET in 2 weeks.  Use Lasix 20 mg daily only as needed.  Continue amlodipine and losartan.  Tobacco abuse: Complete cessation advised.   CV Risk Assessment: ASCVD risk score is 18.2%.  We discussed the individual components including history of hyperlipidemia, hypertension, and tobacco abuse.  She is on high intensity statin therapy but we do not have recent lipid panel to review.  Will contact PCP to see if more recent lab work is available.  Plan/Goals: 1: Aim  to increase physical activity with walking, gradually increasing the time that you walk. 2: Avoid periods of prolonged fasting 3: Eat nutritious foods including fresh or frozen fruits and vegetables, lean protein, beans, and limit saturated fat, processed foods, and high sodium foods.        Disposition:   Signed, Eligha Bridegroom, NP-C

## 2023-03-27 ENCOUNTER — Ambulatory Visit (HOSPITAL_BASED_OUTPATIENT_CLINIC_OR_DEPARTMENT_OTHER): Admitting: Nurse Practitioner

## 2023-03-27 ENCOUNTER — Encounter (HOSPITAL_BASED_OUTPATIENT_CLINIC_OR_DEPARTMENT_OTHER): Payer: Self-pay | Admitting: Nurse Practitioner

## 2023-03-27 VITALS — BP 128/76 | HR 83 | Ht 62.0 in | Wt 205.6 lb

## 2023-03-27 DIAGNOSIS — R931 Abnormal findings on diagnostic imaging of heart and coronary circulation: Secondary | ICD-10-CM

## 2023-03-27 DIAGNOSIS — R0602 Shortness of breath: Secondary | ICD-10-CM | POA: Diagnosis not present

## 2023-03-27 DIAGNOSIS — E785 Hyperlipidemia, unspecified: Secondary | ICD-10-CM | POA: Diagnosis not present

## 2023-03-27 DIAGNOSIS — I10 Essential (primary) hypertension: Secondary | ICD-10-CM | POA: Diagnosis not present

## 2023-03-27 DIAGNOSIS — R600 Localized edema: Secondary | ICD-10-CM | POA: Diagnosis not present

## 2023-03-27 DIAGNOSIS — Z720 Tobacco use: Secondary | ICD-10-CM | POA: Diagnosis not present

## 2023-03-27 DIAGNOSIS — Z7189 Other specified counseling: Secondary | ICD-10-CM | POA: Diagnosis not present

## 2023-03-27 MED ORDER — HYDROCHLOROTHIAZIDE 12.5 MG PO CAPS
12.5000 mg | ORAL_CAPSULE | Freq: Every day | ORAL | 3 refills | Status: AC
Start: 2023-03-27 — End: 2023-06-25

## 2023-03-27 MED ORDER — POTASSIUM CHLORIDE CRYS ER 10 MEQ PO TBCR: 10.0000 meq | EXTENDED_RELEASE_TABLET | Freq: Every day | ORAL | 3 refills | Status: DC

## 2023-03-27 NOTE — Patient Instructions (Signed)
 Medication Instructions:    CONTINUE Lasix as needed.  START hydrochlorothiazide one (1) tablet by mouth ( 25 mg) daily.    START K-dur one (1) tablet by mouth ( 10 mEq) daily.    *If you need a refill on your cardiac medications before your next appointment, please call your pharmacy*   Lab Work:   Your physician recommends that you return for lab work in: 2 weeks, no fasting. Paperwork given to pt today and [facility] listed below.      If you have labs (blood work) drawn today and your tests are completely normal, you will receive your results only by: MyChart Message (if you have MyChart) OR A paper copy in the mail If you have any lab test that is abnormal or we need to change your treatment, we will call you to review the results.   Testing/Procedures:     Please report to Radiology at the [facility] Main Entrance 30 minutes early for your test.  [address] [place], [place] [address]   How to Prepare for Your Cardiac PET/CT Stress Test:  Nothing to eat or drink, except water, 3 hours prior to arrival time.  NO caffeine/decaffeinated products, or chocolate 12 hours prior to arrival. (Please note decaffeinated beverages (teas/coffees) still contain caffeine).  If you have caffeine within 12 hours prior, the test will need to be rescheduled.   Diabetic Preparation: You may take your remaining medications with water.  NO perfume, cologne or lotion on chest or abdomen area. FEMALES - Please avoid wearing dresses to this appointment.  Total time is 1 to 2 hours; you may want to bring reading material for the waiting time.  In preparation for your appointment, medication and supplies will be purchased.  Appointment availability is limited, so if you need to cancel or reschedule, please call the Radiology Department Scheduler at [phone] 24 hours in advance to avoid a cancellation fee of $100.00  What to Expect When you Arrive:  Once you arrive  and check in for your appointment, you will be taken to a preparation room within the Radiology Department.  A technologist or Nurse will obtain your medical history, verify that you are correctly prepped for the exam, and explain the procedure.  Afterwards, an IV will be started in your arm and electrodes will be placed on your skin for EKG monitoring during the stress portion of the exam. Then you will be escorted to the PET/CT scanner.  There, staff will get you positioned on the scanner and obtain a blood pressure and EKG.  During the exam, you will continue to be connected to the EKG and blood pressure machines.  A small, safe amount of a radioactive tracer will be injected in your IV to obtain a series of pictures of your heart along with an injection of a stress agent.    After your Exam:  It is recommended that you eat a meal and drink a caffeinated beverage to counter act any effects of the stress agent.  Drink plenty of fluids for the remainder of the day and urinate frequently for the first couple of hours after the exam.  Your doctor will inform you of your test results within 7-10 business days.  For more information and frequently asked questions, please visit our website: [email or web address]  For questions about your test or how to prepare for your test, please call: Cardiac Imaging Nurse Navigators Office: [phone]    Follow-Up: At [facility], you  and your health needs are our priority.  As part of our continuing mission to provide you with exceptional heart care, we have created designated Provider Care Teams.  These Care Teams include your primary Cardiologist (physician) and Advanced Practice Providers (APPs -  Physician Assistants and Nurse Practitioners) who all work together to provide you with the care you need, when you need it.  We recommend signing up for the patient portal called "MyChart".  Sign up information is provided on this After Visit  Summary.  MyChart is used to connect with patients for Virtual Visits (Telemedicine).  Patients are able to view lab/test results, encounter notes, upcoming appointments, etc.  Non-urgent messages can be sent to your provider as well.   To learn more about what you can do with MyChart, go to [email or web address].    Your next appointment:   3 month(s)  Provider:   Eligha Bridegroom, NP

## 2023-03-28 ENCOUNTER — Other Ambulatory Visit: Payer: Self-pay | Admitting: *Deleted

## 2023-03-28 ENCOUNTER — Telehealth: Payer: Self-pay | Admitting: *Deleted

## 2023-03-28 DIAGNOSIS — E785 Hyperlipidemia, unspecified: Secondary | ICD-10-CM

## 2023-03-28 DIAGNOSIS — Z7189 Other specified counseling: Secondary | ICD-10-CM

## 2023-03-28 DIAGNOSIS — R931 Abnormal findings on diagnostic imaging of heart and coronary circulation: Secondary | ICD-10-CM

## 2023-03-28 DIAGNOSIS — I10 Essential (primary) hypertension: Secondary | ICD-10-CM

## 2023-03-28 MED ORDER — ASPIRIN 81 MG PO TBEC: 81.0000 mg | DELAYED_RELEASE_TABLET | Freq: Every day | ORAL | Status: DC

## 2023-03-28 NOTE — Telephone Encounter (Signed)
 S/w PCP's office due to [facility] needing fasting lab work, pt has not had fasting lab work since 08/2022.   S/w pt's daughter per [facility]) will get fasting lipid/cmet in two weeks instead of bmet.  Removed bmet and placed orders and released for new labs. Pt will start ASA 81 EC, updated medication list.

## 2023-04-11 DIAGNOSIS — R931 Abnormal findings on diagnostic imaging of heart and coronary circulation: Secondary | ICD-10-CM | POA: Diagnosis not present

## 2023-04-11 DIAGNOSIS — Z7189 Other specified counseling: Secondary | ICD-10-CM | POA: Diagnosis not present

## 2023-04-11 DIAGNOSIS — E785 Hyperlipidemia, unspecified: Secondary | ICD-10-CM | POA: Diagnosis not present

## 2023-04-11 DIAGNOSIS — I10 Essential (primary) hypertension: Secondary | ICD-10-CM | POA: Diagnosis not present

## 2023-04-12 LAB — COMPREHENSIVE METABOLIC PANEL WITH GFR
ALT: 49 IU/L — ABNORMAL HIGH (ref 0–32)
AST: 46 IU/L — ABNORMAL HIGH (ref 0–40)
Albumin: 4.4 g/dL (ref 3.9–4.9)
Alkaline Phosphatase: 102 IU/L (ref 44–121)
BUN/Creatinine Ratio: 14 (ref 12–28)
BUN: 13 mg/dL (ref 8–27)
Bilirubin Total: 0.3 mg/dL (ref 0.0–1.2)
CO2: 20 mmol/L (ref 20–29)
Calcium: 9.9 mg/dL (ref 8.7–10.3)
Chloride: 104 mmol/L (ref 96–106)
Creatinine, Ser: 0.96 mg/dL (ref 0.57–1.00)
Globulin, Total: 2.9 g/dL (ref 1.5–4.5)
Glucose: 89 mg/dL (ref 70–99)
Potassium: 4.1 mmol/L (ref 3.5–5.2)
Sodium: 140 mmol/L (ref 134–144)
Total Protein: 7.3 g/dL (ref 6.0–8.5)
eGFR: 64 mL/min/{1.73_m2} (ref >59)

## 2023-04-12 LAB — LIPID PANEL
Chol/HDL Ratio: 2.4 ratio (ref 0.0–4.4)
Cholesterol, Total: 161 mg/dL (ref 100–199)
HDL: 66 mg/dL (ref >39)
LDL Chol Calc (NIH): 68 mg/dL (ref 0–99)
Triglycerides: 159 mg/dL — ABNORMAL HIGH (ref 0–149)
VLDL Cholesterol Cal: 27 mg/dL (ref 5–40)

## 2023-04-17 ENCOUNTER — Institutional Professional Consult (permissible substitution) (HOSPITAL_BASED_OUTPATIENT_CLINIC_OR_DEPARTMENT_OTHER): Payer: 59 | Admitting: Nurse Practitioner

## 2023-05-22 DIAGNOSIS — F17210 Nicotine dependence, cigarettes, uncomplicated: Secondary | ICD-10-CM | POA: Diagnosis not present

## 2023-05-22 DIAGNOSIS — D696 Thrombocytopenia, unspecified: Secondary | ICD-10-CM | POA: Diagnosis not present

## 2023-05-22 DIAGNOSIS — K219 Gastro-esophageal reflux disease without esophagitis: Secondary | ICD-10-CM | POA: Diagnosis not present

## 2023-05-22 DIAGNOSIS — J449 Chronic obstructive pulmonary disease, unspecified: Secondary | ICD-10-CM | POA: Diagnosis not present

## 2023-05-22 DIAGNOSIS — I10 Essential (primary) hypertension: Secondary | ICD-10-CM | POA: Diagnosis not present

## 2023-05-22 DIAGNOSIS — I2510 Atherosclerotic heart disease of native coronary artery without angina pectoris: Secondary | ICD-10-CM | POA: Diagnosis not present

## 2023-05-22 DIAGNOSIS — E785 Hyperlipidemia, unspecified: Secondary | ICD-10-CM | POA: Diagnosis not present

## 2023-05-22 DIAGNOSIS — J439 Emphysema, unspecified: Secondary | ICD-10-CM | POA: Diagnosis not present

## 2023-05-22 DIAGNOSIS — I700 Atherosclerosis of aorta: Secondary | ICD-10-CM | POA: Diagnosis not present

## 2023-06-10 ENCOUNTER — Telehealth (HOSPITAL_COMMUNITY): Payer: Self-pay | Admitting: *Deleted

## 2023-06-10 NOTE — Telephone Encounter (Signed)
 Reaching out to patient to offer assistance regarding upcoming cardiac imaging study; pt's daughter answered phone and verbalizes understanding of appt date/time, parking situation and where to check in, pre-test NPO status; name and call back number provided for further questions should they arise  Chase Copping RN Navigator Cardiac Imaging [facility] [phone] office [phone] cell  Patient's daughter aware the patient is to avoid caffeine 12 hours prior to her cardiac PET scan.

## 2023-06-11 ENCOUNTER — Ambulatory Visit (HOSPITAL_COMMUNITY)
Admission: RE | Admit: 2023-06-11 | Discharge: 2023-06-11 | Disposition: A | Discharge status: Home / Self Care | Source: Ambulatory Visit | Attending: Nurse Practitioner | Admitting: Nurse Practitioner

## 2023-06-11 DIAGNOSIS — R931 Abnormal findings on diagnostic imaging of heart and coronary circulation: Secondary | ICD-10-CM

## 2023-06-11 DIAGNOSIS — I2510 Atherosclerotic heart disease of native coronary artery without angina pectoris: Secondary | ICD-10-CM | POA: Diagnosis not present

## 2023-06-11 LAB — NM PET CT CARDIAC PERFUSION MULTI W/ABSOLUTE BLOODFLOW
MBFR: 1.78
Nuc Rest EF: 64 %
Nuc Stress EF: 68 %
Rest MBF: 1.25 ml/g/min
Rest Nuclear Isotope Dose: 24.1 mCi
Stress MBF: 2.22 ml/g/min
Stress Nuclear Isotope Dose: 24.2 mCi
TID: 1.05

## 2023-06-11 MED ORDER — RUBIDIUM RB82 GENERATOR (RUBYFILL)
24.2000 | PACK | Freq: Once | INTRAVENOUS | Status: AC
  Administered 2023-06-11: 24.2 via INTRAVENOUS

## 2023-06-11 MED ORDER — REGADENOSON 0.4 MG/5ML IV SOLN
0.4000 mg | Freq: Once | INTRAVENOUS | Status: AC
  Administered 2023-06-11: 0.4 mg via INTRAVENOUS

## 2023-06-11 MED ORDER — REGADENOSON 0.4 MG/5ML IV SOLN
INTRAVENOUS | Status: AC
  Filled 2023-06-11: qty 5

## 2023-06-11 MED ORDER — RUBIDIUM RB82 GENERATOR (RUBYFILL)
24.0500 | PACK | Freq: Once | INTRAVENOUS | Status: AC
  Administered 2023-06-11: 24.05 via INTRAVENOUS

## 2023-06-11 NOTE — Progress Notes (Signed)
 Pt. Tolerated lexi scan well.

## 2023-06-12 ENCOUNTER — Ambulatory Visit (HOSPITAL_BASED_OUTPATIENT_CLINIC_OR_DEPARTMENT_OTHER): Payer: Self-pay | Admitting: Nurse Practitioner

## 2023-06-12 ENCOUNTER — Encounter (HOSPITAL_BASED_OUTPATIENT_CLINIC_OR_DEPARTMENT_OTHER): Payer: Self-pay | Admitting: Nurse Practitioner

## 2023-06-12 ENCOUNTER — Ambulatory Visit (HOSPITAL_BASED_OUTPATIENT_CLINIC_OR_DEPARTMENT_OTHER): Admitting: Nurse Practitioner

## 2023-06-12 VITALS — BP 128/84 | HR 78 | Ht 63.0 in | Wt 206.0 lb

## 2023-06-12 DIAGNOSIS — R0989 Other specified symptoms and signs involving the circulatory and respiratory systems: Secondary | ICD-10-CM | POA: Diagnosis not present

## 2023-06-12 DIAGNOSIS — Z720 Tobacco use: Secondary | ICD-10-CM

## 2023-06-12 DIAGNOSIS — I2510 Atherosclerotic heart disease of native coronary artery without angina pectoris: Secondary | ICD-10-CM | POA: Diagnosis not present

## 2023-06-12 DIAGNOSIS — I25118 Atherosclerotic heart disease of native coronary artery with other forms of angina pectoris: Secondary | ICD-10-CM

## 2023-06-12 DIAGNOSIS — R0602 Shortness of breath: Secondary | ICD-10-CM

## 2023-06-12 DIAGNOSIS — I2583 Coronary atherosclerosis due to lipid rich plaque: Secondary | ICD-10-CM | POA: Diagnosis not present

## 2023-06-12 DIAGNOSIS — R600 Localized edema: Secondary | ICD-10-CM

## 2023-06-12 DIAGNOSIS — I10 Essential (primary) hypertension: Secondary | ICD-10-CM | POA: Diagnosis not present

## 2023-06-12 DIAGNOSIS — R931 Abnormal findings on diagnostic imaging of heart and coronary circulation: Secondary | ICD-10-CM

## 2023-06-12 DIAGNOSIS — E785 Hyperlipidemia, unspecified: Secondary | ICD-10-CM

## 2023-06-12 DIAGNOSIS — E7800 Pure hypercholesterolemia, unspecified: Secondary | ICD-10-CM | POA: Diagnosis not present

## 2023-06-12 MED ORDER — NITROGLYCERIN 0.4 MG SL SUBL: 0.4000 mg | SUBLINGUAL_TABLET | SUBLINGUAL | 3 refills | Status: DC | PRN

## 2023-06-12 MED ORDER — NITROGLYCERIN 0.4 MG SL SUBL: 0.4000 mg | SUBLINGUAL_TABLET | SUBLINGUAL | 3 refills | Status: AC | PRN

## 2023-06-12 NOTE — Addendum Note (Signed)
 Addended by: Gerldine Koch on: 06/12/2023 01:02 PM   Modules accepted: Orders

## 2023-06-12 NOTE — Progress Notes (Signed)
 Cardiology Office Note:  .   Date:  06/12/2023 ID:  Tiffany Frye, DOB Jun 22, 1955, MRN [id number] PCP: Tiffany Mina, MD [facility] Providers Cardiologist:  None   Patient Profile: .      PMH Coronary artery disease CT calcium score 02/12/23 CAC score 4304 (99th percentile) LM 0, LAD 1637, LCx 865, RCA 1803 Aortic atherosclerosis Hypertension Hyperlipidemia COPD Tobacco dependence  Referred to cardiology and seen by me on 03/27/23 for elevated coronary calcium score. She is here with her daughter.  She reports CT calcium score was ordered based on calcification seen on lung cancer screening CT.  She reports shortness of breath when performing activities such as unloading groceries or doing housework.  This has been going on for quite a while and will resolve once she has rested for a few minutes. She denies chest pain or palpitations. Unfortunately, she continues to smoke.  She reports bilateral ankle swelling that initially occurred occasionally but has recently increased.  She was given a prescription for Lasix as needed by PCP, but she is taking it daily now.  She admits she does not engage in physical activity very often. She also skips meals for unclear reasons.  Her daughter does most of the cooking for her and reports she eats small portions.  She also gets a daytime meal from [employer].  Reports that BP has been well-controlled.  Family history is significant for her mother who had heart disease but per her report, this was later in life.  She occasionally drinks alcohol and soda but mostly drinks water.         History of Present Illness: .    History of Present Illness Tiffany Frye is a pleasant 68 year old female who presents to day for follow-up of CAD and abnormal cardiac PET CT results.  We reviewed test results in detail. Her daughter is here with her.  Patient reports occasional shortness of breath, particularly with walking longer distances, causing fatigue that  primarily affects her breathing. She can walk short distances without significant shortness of breath and perform household activities like mopping without difficulty. Her daughter reports concern that her shortness of breath is worse than the pt indicates.  She also has a history of smoking and is not interested in quitting. She denies chest pain, palpitations, orthopnea, PND, edema, presyncope, syncope. She does not exercise.    Family history: Her family history includes Breast cancer (age of onset: 47) in her sister.  MGF - died in his sleep, presumed to be MI Mother had diabetes  Discussed the use of AI scribe software for clinical note transcription with the patient, who gave verbal consent to proceed. No results found for: "LIPOA"   ROS: See HPI       Studies Reviewed: Tiffany Frye   EKG Interpretation Date/Time:  Wednesday June 12 2023 10:00:43 EDT Ventricular Rate:  74 PR Interval:  186 QRS Duration:  78 QT Interval:  392 QTC Calculation: 435 R Axis:   54  Text Interpretation: Normal sinus rhythm Normal ECG When compared with ECG of 27-Mar-2023 14:13, Nonspecific T wave abnormality, improved in Anterior leads Confirmed by Tiffany Frye [phone]) on 06/12/2023 10:02:01 AM       Risk Assessment/Calculations:             Physical Exam:   VS: BP 128/84   Pulse 78   Ht 5\' 3"  (1.6 m)   Wt 206 lb (93.4 kg)   SpO2 98%  BMI 36.49 kg/m   Wt Readings from Last 3 Encounters:  06/12/23 206 lb (93.4 kg)  03/27/23 205 lb 9.6 oz (93.3 kg)  06/11/22 190 lb (86.2 kg)     GEN: Well nourished, well developed in no acute distress NECK: No JVD; No carotid bruits CARDIAC: RRR, no murmurs, rubs, gallops RESPIRATORY:  Diminished breath sounds bilaterally without rales, wheezing or rhonchi  ABDOMEN: Soft, non-tender, non-distended EXTREMITIES:  No edema; No deformity     ASSESSMENT AND PLAN: .    CAD: Significantly elevated CAC of 4304 (99th percentile) with heavy calcification of LAD,  LCx, and RCA.  Due to this high score and additional risk factors of hyperlipidemia, hypertension, and tobacco abuse, she underwent cardiac PET/CT for evaluation of ischemia.  PET/CT revealed reversible inferior perfusion defect consistent with ischemia, and stress EKG positive for ischemia. We had a lengthy discussion about symptoms.  She continues to have shortness of breath with walking moderate distance.  Through shared decision making, we decided to proceed with cardiac catheterization.  Risks were reviewed and she agrees to proceed.  Will give her a prescription for sublingual nitroglycerin with instructions on use. ER precautions advised. Continue aspirin , losartan, atorvastatin, amlodipine.  Shortness of breath: Shortness of breath ongoing for some time. Reports shortness of breath that requires rest with walking moderate distance. She is a longtime smoker and has been diagnosed with COPD. SOB is not accompanied by chest pain or palpitations.No orthopnea, PND, or edema. Weight is stable. We will proceed with cardiac cath as noted above.   Hyperlipidemia LDL goal < 70: Lipid panel completed 04/11/2023 with total cholesterol 161, triglycerides 159, HDL 66, and LDL-C 68.  She had good improvement on atorvastatin 40 mg daily with LDL decreased from 91.  Continue atorvastatin.  Leg edema: Leg edema has improved on hydrochlorothiazide .  She denies concerns with leg edema today.   Hypertension: BP is well controlled.  We will update labs to evaluate renal function today.  No change in antihypertensive therapy.  Tobacco abuse: Unfortunately she continues to smoke. She is not interested in quitting. Complete cessation advised.     Informed Consent   Shared Decision Making/Informed Consent The risks [stroke (1 in 1000), death (1 in 1000), kidney failure [usually temporary] (1 in 500), bleeding (1 in 200), allergic reaction [possibly serious] (1 in 200)], benefits (diagnostic support and management of  coronary artery disease) and alternatives of a cardiac catheterization were discussed in detail with Tiffany Frye and she is willing to proceed.         Disposition: 4-6 weeks with me  Signed, Tiffany Duncan, NP-C

## 2023-06-12 NOTE — Patient Instructions (Signed)
 Medication Instructions:  Take One (1) tablet by mouth (0.4) sublingual (under tongue) as needed.    If a single episode of chest pain is not relieved by one tablet, the patient will try another within 5 minutes; and if this doesn't relieve the pain, the patient will try another within 5 minutes and if this doesn't relieve the pain the patient is instructed to call 911 for transportation to an emergency department.  Your physician recommends that you continue on your current medications as directed. Please refer to the Current Medication list given to you today.   *If you need a refill on your cardiac medications before your next appointment, please call your pharmacy*  Lab Work:  TODAY!!!! BMET/CBC  If you have labs (blood work) drawn today and your tests are completely normal, you will receive your results only by: MyChart Message (if you have MyChart) OR A paper copy in the mail If you have any lab test that is abnormal or we need to change your treatment, we will call you to review the results.  Testing/Procedures:        Cardiac/Peripheral Catheterization   You are scheduled for a Cardiac Catheterization on Monday, June 9 with Dr. Randene Bustard.  1. Please arrive at the [facility] (Main Entrance A) at [facility]: [address] [place], [place] [address] at 5:30 AM (This time is 2 hour(s) before your procedure to ensure your preparation).   Free valet parking service is available. You will check in at ADMITTING. The support person will be asked to wait in the waiting room.  It is OK to have someone drop you off and come back when you are ready to be discharged.        Special note: Every effort is made to have your procedure done on time. Please understand that emergencies sometimes delay scheduled procedures.  2. Diet: Do not eat solid foods after midnight.  You may have clear liquids until 5 AM the day of the procedure.  3. Labs: You will need to have blood drawn  on Wednesday, June 4 at [facility] at [facility]:  [address] [place], [place] [address] (lab is in the Primary Care office located on the 3rd floor).  Hours: 8:00 am - 4:30 pm (avoid 12:00 pm - 1:00 pm)  Phone: [phone].  Tell staff you are there for blood work and they will direct you to the lab.  You do not need an appointment. You do not need to be fasting.  4. Medication instructions in preparation for your procedure:   Contrast Allergy: No  HOLD hydrochlorothiazide  AND lasix the am of procedure.   On the morning of your procedure, take Aspirin  81 mg and any morning medicines NOT listed above.  You may use sips of water.  5. Plan to go home the same day, you will only stay overnight if medically necessary. 6. You MUST have a responsible adult to drive you home. 7. An adult MUST be with you the first 24 hours after you arrive home. 8. Bring a current list of your medications, and the last time and date medication taken. 9. Bring ID and current insurance cards. 10.Please wear clothes that are easy to get on and off and wear slip-on shoes.  Thank you for allowing us  to care for you!   -- [facility]   Follow-Up: At [facility], you and your health needs are our priority.  As  part of our continuing mission to provide you with exceptional heart care, our providers are all part of one team.  This team includes your primary Cardiologist (physician) and Advanced Practice Providers or APPs (Physician Assistants and Nurse Practitioners) who all work together to provide you with the care you need, when you need it.  Your next appointment:   3 week(s)  Provider:   Slater Duncan, NP    We recommend signing up for the patient portal called "MyChart".  Sign up information is provided on this After Visit Summary.  MyChart is used to connect with patients for Virtual Visits (Telemedicine).  Patients are able to view  lab/test results, encounter notes, upcoming appointments, etc.  Non-urgent messages can be sent to your provider as well.   To learn more about what you can do with MyChart, go to [email or web address].

## 2023-06-12 NOTE — H&P (View-Only) (Signed)
 Cardiology Office Note:  .   Date:  06/12/2023 ID:  Tiffany Frye, DOB Jun 22, 1955, MRN [id number] PCP: Tiffany Mina, MD [facility] Providers Cardiologist:  None   Patient Profile: .      PMH Coronary artery disease CT calcium score 02/12/23 CAC score 4304 (99th percentile) LM 0, LAD 1637, LCx 865, RCA 1803 Aortic atherosclerosis Hypertension Hyperlipidemia COPD Tobacco dependence  Referred to cardiology and seen by me on 03/27/23 for elevated coronary calcium score. She is here with her daughter.  She reports CT calcium score was ordered based on calcification seen on lung cancer screening CT.  She reports shortness of breath when performing activities such as unloading groceries or doing housework.  This has been going on for quite a while and will resolve once she has rested for a few minutes. She denies chest pain or palpitations. Unfortunately, she continues to smoke.  She reports bilateral ankle swelling that initially occurred occasionally but has recently increased.  She was given a prescription for Lasix as needed by PCP, but she is taking it daily now.  She admits she does not engage in physical activity very often. She also skips meals for unclear reasons.  Her daughter does most of the cooking for her and reports she eats small portions.  She also gets a daytime meal from [employer].  Reports that BP has been well-controlled.  Family history is significant for her mother who had heart disease but per her report, this was later in life.  She occasionally drinks alcohol and soda but mostly drinks water.         History of Present Illness: .    History of Present Illness Tiffany Frye is a pleasant 68 year old female who presents to day for follow-up of CAD and abnormal cardiac PET CT results.  We reviewed test results in detail. Her daughter is here with her.  Patient reports occasional shortness of breath, particularly with walking longer distances, causing fatigue that  primarily affects her breathing. She can walk short distances without significant shortness of breath and perform household activities like mopping without difficulty. Her daughter reports concern that her shortness of breath is worse than the pt indicates.  She also has a history of smoking and is not interested in quitting. She denies chest pain, palpitations, orthopnea, PND, edema, presyncope, syncope. She does not exercise.    Family history: Her family history includes Breast cancer (age of onset: 47) in her sister.  MGF - died in his sleep, presumed to be MI Mother had diabetes  Discussed the use of AI scribe software for clinical note transcription with the patient, who gave verbal consent to proceed. No results found for: "LIPOA"   ROS: See HPI       Studies Reviewed: Tiffany Frye   EKG Interpretation Date/Time:  Wednesday June 12 2023 10:00:43 EDT Ventricular Rate:  74 PR Interval:  186 QRS Duration:  78 QT Interval:  392 QTC Calculation: 435 R Axis:   54  Text Interpretation: Normal sinus rhythm Normal ECG When compared with ECG of 27-Mar-2023 14:13, Nonspecific T wave abnormality, improved in Anterior leads Confirmed by Tiffany Frye [phone]) on 06/12/2023 10:02:01 AM       Risk Assessment/Calculations:             Physical Exam:   VS: BP 128/84   Pulse 78   Ht 5\' 3"  (1.6 m)   Wt 206 lb (93.4 kg)   SpO2 98%  BMI 36.49 kg/m   Wt Readings from Last 3 Encounters:  06/12/23 206 lb (93.4 kg)  03/27/23 205 lb 9.6 oz (93.3 kg)  06/11/22 190 lb (86.2 kg)     GEN: Well nourished, well developed in no acute distress NECK: No JVD; No carotid bruits CARDIAC: RRR, no murmurs, rubs, gallops RESPIRATORY:  Diminished breath sounds bilaterally without rales, wheezing or rhonchi  ABDOMEN: Soft, non-tender, non-distended EXTREMITIES:  No edema; No deformity     ASSESSMENT AND PLAN: .    CAD: Significantly elevated CAC of 4304 (99th percentile) with heavy calcification of LAD,  LCx, and RCA.  Due to this high score and additional risk factors of hyperlipidemia, hypertension, and tobacco abuse, she underwent cardiac PET/CT for evaluation of ischemia.  PET/CT revealed reversible inferior perfusion defect consistent with ischemia, and stress EKG positive for ischemia. We had a lengthy discussion about symptoms.  She continues to have shortness of breath with walking moderate distance.  Through shared decision making, we decided to proceed with cardiac catheterization.  Risks were reviewed and she agrees to proceed.  Will give her a prescription for sublingual nitroglycerin with instructions on use. ER precautions advised. Continue aspirin , losartan, atorvastatin, amlodipine.  Shortness of breath: Shortness of breath ongoing for some time. Reports shortness of breath that requires rest with walking moderate distance. She is a longtime smoker and has been diagnosed with COPD. SOB is not accompanied by chest pain or palpitations.No orthopnea, PND, or edema. Weight is stable. We will proceed with cardiac cath as noted above.   Hyperlipidemia LDL goal < 70: Lipid panel completed 04/11/2023 with total cholesterol 161, triglycerides 159, HDL 66, and LDL-C 68.  She had good improvement on atorvastatin 40 mg daily with LDL decreased from 91.  Continue atorvastatin.  Leg edema: Leg edema has improved on hydrochlorothiazide .  She denies concerns with leg edema today.   Hypertension: BP is well controlled.  We will update labs to evaluate renal function today.  No change in antihypertensive therapy.  Tobacco abuse: Unfortunately she continues to smoke. She is not interested in quitting. Complete cessation advised.     Informed Consent   Shared Decision Making/Informed Consent The risks [stroke (1 in 1000), death (1 in 1000), kidney failure [usually temporary] (1 in 500), bleeding (1 in 200), allergic reaction [possibly serious] (1 in 200)], benefits (diagnostic support and management of  coronary artery disease) and alternatives of a cardiac catheterization were discussed in detail with Tiffany Frye and she is willing to proceed.         Disposition: 4-6 weeks with me  Signed, Tiffany Duncan, NP-C

## 2023-06-13 ENCOUNTER — Telehealth: Payer: Self-pay | Admitting: *Deleted

## 2023-06-13 ENCOUNTER — Ambulatory Visit (HOSPITAL_BASED_OUTPATIENT_CLINIC_OR_DEPARTMENT_OTHER): Payer: Self-pay | Admitting: Nurse Practitioner

## 2023-06-13 LAB — BASIC METABOLIC PANEL WITH GFR
BUN/Creatinine Ratio: 13 (ref 12–28)
BUN: 12 mg/dL (ref 8–27)
CO2: 19 mmol/L — ABNORMAL LOW (ref 20–29)
Calcium: 9.6 mg/dL (ref 8.7–10.3)
Chloride: 108 mmol/L — ABNORMAL HIGH (ref 96–106)
Creatinine, Ser: 0.94 mg/dL (ref 0.57–1.00)
Glucose: 103 mg/dL — ABNORMAL HIGH (ref 70–99)
Potassium: 4.3 mmol/L (ref 3.5–5.2)
Sodium: 145 mmol/L — ABNORMAL HIGH (ref 134–144)
eGFR: 65 mL/min/{1.73_m2} (ref >59)

## 2023-06-13 LAB — CBC
Hematocrit: 36 % (ref 34.0–46.6)
Hemoglobin: 11.8 g/dL (ref 11.1–15.9)
MCH: 29.6 pg (ref 26.6–33.0)
MCHC: 32.8 g/dL (ref 31.5–35.7)
MCV: 91 fL (ref 79–97)
Platelets: 247 10*3/uL (ref 150–450)
RBC: 3.98 x10E6/uL (ref 3.77–5.28)
RDW: 14.1 % (ref 11.7–15.4)
WBC: 9.8 10*3/uL (ref 3.4–10.8)

## 2023-06-13 NOTE — Telephone Encounter (Signed)
 Cardiac Catheterization scheduled at [facility] for: Monday June 17, 2023 7:30 AM Arrival time [facility] Main Entrance A at: 5:30 AM  Nothing to eat after midnight prior to procedure, clear liquids until 5 AM day of procedure.  Medication instructions: -Hold:  Hydrochlorothiazide /KCL-AM of procedure  -Other usual morning medications can be taken with sips of water including aspirin  81 mg.  Plan to go home the same day, you will only stay overnight if medically necessary.  You must have responsible adult to drive you home.  Someone must be with you the first 24 hours after you arrive home.  Reviewed procedure instructions with patient's daughter (DPR), Mela Spinner.

## 2023-06-17 ENCOUNTER — Encounter (HOSPITAL_COMMUNITY)
Admission: RE | Disposition: A | Discharge status: Home / Self Care | Payer: Self-pay | Source: Home / Self Care | Attending: Cardiology

## 2023-06-17 ENCOUNTER — Ambulatory Visit (HOSPITAL_COMMUNITY)
Admission: RE | Admit: 2023-06-17 | Discharge: 2023-06-17 | Disposition: A | Discharge status: Home / Self Care | Attending: Cardiology | Admitting: Cardiology

## 2023-06-17 ENCOUNTER — Encounter (HOSPITAL_COMMUNITY): Payer: Self-pay | Admitting: Cardiology

## 2023-06-17 ENCOUNTER — Other Ambulatory Visit: Payer: Self-pay

## 2023-06-17 DIAGNOSIS — R600 Localized edema: Secondary | ICD-10-CM | POA: Diagnosis not present

## 2023-06-17 DIAGNOSIS — R0609 Other forms of dyspnea: Secondary | ICD-10-CM | POA: Insufficient documentation

## 2023-06-17 DIAGNOSIS — J449 Chronic obstructive pulmonary disease, unspecified: Secondary | ICD-10-CM | POA: Insufficient documentation

## 2023-06-17 DIAGNOSIS — I2584 Coronary atherosclerosis due to calcified coronary lesion: Secondary | ICD-10-CM | POA: Insufficient documentation

## 2023-06-17 DIAGNOSIS — I10 Essential (primary) hypertension: Secondary | ICD-10-CM | POA: Diagnosis not present

## 2023-06-17 DIAGNOSIS — R0989 Other specified symptoms and signs involving the circulatory and respiratory systems: Secondary | ICD-10-CM

## 2023-06-17 DIAGNOSIS — Z79899 Other long term (current) drug therapy: Secondary | ICD-10-CM | POA: Insufficient documentation

## 2023-06-17 DIAGNOSIS — R9439 Abnormal result of other cardiovascular function study: Secondary | ICD-10-CM | POA: Insufficient documentation

## 2023-06-17 DIAGNOSIS — I2582 Chronic total occlusion of coronary artery: Secondary | ICD-10-CM | POA: Diagnosis not present

## 2023-06-17 DIAGNOSIS — I2510 Atherosclerotic heart disease of native coronary artery without angina pectoris: Secondary | ICD-10-CM | POA: Diagnosis not present

## 2023-06-17 DIAGNOSIS — R0602 Shortness of breath: Secondary | ICD-10-CM

## 2023-06-17 DIAGNOSIS — E785 Hyperlipidemia, unspecified: Secondary | ICD-10-CM | POA: Diagnosis not present

## 2023-06-17 DIAGNOSIS — F17200 Nicotine dependence, unspecified, uncomplicated: Secondary | ICD-10-CM | POA: Insufficient documentation

## 2023-06-17 DIAGNOSIS — Z8249 Family history of ischemic heart disease and other diseases of the circulatory system: Secondary | ICD-10-CM | POA: Insufficient documentation

## 2023-06-17 DIAGNOSIS — I25118 Atherosclerotic heart disease of native coronary artery with other forms of angina pectoris: Secondary | ICD-10-CM

## 2023-06-17 HISTORY — PX: LEFT HEART CATH AND CORONARY ANGIOGRAPHY: CATH118249

## 2023-06-17 SURGERY — LEFT HEART CATH AND CORONARY ANGIOGRAPHY
Anesthesia: LOCAL

## 2023-06-17 MED ORDER — MIDAZOLAM HCL 2 MG/2ML IJ SOLN
INTRAMUSCULAR | Status: DC | PRN
Start: 2023-06-17 — End: 2023-06-17
  Administered 2023-06-17: 1 mg via INTRAVENOUS

## 2023-06-17 MED ORDER — SODIUM CHLORIDE 0.9% FLUSH
3.0000 mL | INTRAVENOUS | Status: DC | PRN
Start: 2023-06-17 — End: 2023-06-17

## 2023-06-17 MED ORDER — LIDOCAINE HCL (PF) 1 % IJ SOLN
INTRAMUSCULAR | Status: DC | PRN
  Administered 2023-06-17: 2 mL

## 2023-06-17 MED ORDER — LIDOCAINE HCL (PF) 1 % IJ SOLN
INTRAMUSCULAR | Status: AC
  Filled 2023-06-17: qty 30

## 2023-06-17 MED ORDER — ASPIRIN 81 MG PO CHEW: 81.0000 mg | CHEWABLE_TABLET | ORAL | Status: DC

## 2023-06-17 MED ORDER — VERAPAMIL HCL 2.5 MG/ML IV SOLN
INTRAVENOUS | Status: AC
  Filled 2023-06-17: qty 2

## 2023-06-17 MED ORDER — HEPARIN (PORCINE) IN NACL 1000-0.9 UT/500ML-% IV SOLN
INTRAVENOUS | Status: DC | PRN
  Administered 2023-06-17: 1000 mL

## 2023-06-17 MED ORDER — ONDANSETRON HCL 4 MG/2ML IJ SOLN: 4.0000 mg | Freq: Four times a day (QID) | INTRAMUSCULAR | Status: DC | PRN

## 2023-06-17 MED ORDER — ACETAMINOPHEN 325 MG PO TABS
650.0000 mg | ORAL_TABLET | ORAL | Status: DC | PRN
Start: 2023-06-17 — End: 2023-06-17

## 2023-06-17 MED ORDER — LABETALOL HCL 5 MG/ML IV SOLN: 10.0000 mg | INTRAVENOUS | Status: DC | PRN

## 2023-06-17 MED ORDER — SODIUM CHLORIDE 0.9 % WEIGHT BASED INFUSION: 1.0000 mL/kg/h | INTRAVENOUS | Status: DC

## 2023-06-17 MED ORDER — FENTANYL CITRATE (PF) 100 MCG/2ML IJ SOLN
INTRAMUSCULAR | Status: DC | PRN
  Administered 2023-06-17: 25 ug via INTRAVENOUS

## 2023-06-17 MED ORDER — FENTANYL CITRATE (PF) 100 MCG/2ML IJ SOLN
INTRAMUSCULAR | Status: AC
  Filled 2023-06-17: qty 2

## 2023-06-17 MED ORDER — VERAPAMIL HCL 2.5 MG/ML IV SOLN
INTRAVENOUS | Status: DC | PRN
  Administered 2023-06-17: 10 mL via INTRA_ARTERIAL

## 2023-06-17 MED ORDER — SODIUM CHLORIDE 0.9 % IV SOLN
INTRAVENOUS | Status: DC | PRN
  Administered 2023-06-17: 10 mL/h via INTRAVENOUS

## 2023-06-17 MED ORDER — HEPARIN SODIUM (PORCINE) 1000 UNIT/ML IJ SOLN
INTRAMUSCULAR | Status: AC
  Filled 2023-06-17: qty 10

## 2023-06-17 MED ORDER — IOHEXOL 350 MG/ML SOLN
INTRAVENOUS | Status: DC | PRN
  Administered 2023-06-17: 50 mL

## 2023-06-17 MED ORDER — HEPARIN SODIUM (PORCINE) 1000 UNIT/ML IJ SOLN
INTRAMUSCULAR | Status: DC | PRN
  Administered 2023-06-17: 4500 [IU] via INTRAVENOUS

## 2023-06-17 MED ORDER — SODIUM CHLORIDE 0.9 % WEIGHT BASED INFUSION: 3.0000 mL/kg/h | INTRAVENOUS | Status: AC

## 2023-06-17 MED ORDER — MIDAZOLAM HCL 2 MG/2ML IJ SOLN
INTRAMUSCULAR | Status: AC
  Filled 2023-06-17: qty 2

## 2023-06-17 MED ORDER — HYDRALAZINE HCL 20 MG/ML IJ SOLN: 10.0000 mg | INTRAMUSCULAR | Status: DC | PRN

## 2023-06-17 MED ORDER — SODIUM CHLORIDE 0.9 % IV SOLN: 250.0000 mL | INTRAVENOUS | Status: DC | PRN

## 2023-06-17 MED ORDER — SODIUM CHLORIDE 0.9% FLUSH: 3.0000 mL | Freq: Two times a day (BID) | INTRAVENOUS | Status: DC

## 2023-06-17 SURGICAL SUPPLY — 7 items
CATH 5FR JL3.5 JR4 ANG PIG MP (CATHETERS) IMPLANT
COVER PRB 48X5XTLSCP FOLD TPE (BAG) IMPLANT
DEVICE RAD COMP TR BAND LRG (VASCULAR PRODUCTS) IMPLANT
GLIDESHEATH SLEND SS 6F .021 (SHEATH) IMPLANT
GUIDEWIRE INQWIRE 1.5J.035X260 (WIRE) IMPLANT
KIT SYRINGE INJ CVI SPIKEX1 (MISCELLANEOUS) IMPLANT
PACK CARDIAC CATHETERIZATION (CUSTOM PROCEDURE TRAY) ×2 IMPLANT

## 2023-06-17 NOTE — Interval H&P Note (Signed)
 History and Physical Interval Note:  06/17/2023 7:37 AM  Tiffany Frye  has presented today for surgery, with the diagnosis of abnormal pet.  The various methods of treatment have been discussed with the patient and family. After consideration of risks, benefits and other options for treatment, the patient has consented to  Procedure(s): LEFT HEART CATH AND CORONARY ANGIOGRAPHY (N/A) PERCUTANEOUS CORONARY INTERVENTION    as a surgical intervention.  The patient's history has been reviewed, patient examined, no change in status, stable for surgery.  I have reviewed the patient's chart and labs.  Questions were answered to the patient's satisfaction.    Cath Lab Visit (complete for each Cath Lab visit)  Clinical Evaluation Leading to the Procedure:   ACS: No.  Non-ACS:    Anginal Classification: CCS II  Anti-ischemic medical therapy: Minimal Therapy (1 class of medications)  Non-Invasive Test Results: Intermediate-risk stress test findings: cardiac mortality 1-3%/year  Prior CABG: No previous CABG     Randene Bustard

## 2023-06-17 NOTE — Progress Notes (Signed)
 Swelling noted right radial; client c/o pain with holding pressure x 10 min and right radial softer and states pain decreased right radial site; Polly Brink from cath lab in to check right radial site and will wait an additional prior to removing air

## 2023-06-19 ENCOUNTER — Ambulatory Visit: Admitting: Nurse Practitioner

## 2023-06-24 ENCOUNTER — Telehealth: Payer: Self-pay | Admitting: Nurse Practitioner

## 2023-06-24 MED ORDER — ASPIRIN 81 MG PO TBEC: 81.0000 mg | DELAYED_RELEASE_TABLET | Freq: Every day | ORAL | 3 refills | Status: DC

## 2023-06-24 MED ORDER — POTASSIUM CHLORIDE CRYS ER 10 MEQ PO TBCR: 10.0000 meq | EXTENDED_RELEASE_TABLET | Freq: Every day | ORAL | 3 refills | Status: AC

## 2023-06-24 MED ORDER — HYDROCHLOROTHIAZIDE 12.5 MG PO CAPS: 12.5000 mg | ORAL_CAPSULE | Freq: Every day | ORAL | 3 refills | Status: DC

## 2023-06-24 NOTE — Telephone Encounter (Signed)
 RX sent to requested Pharmacy

## 2023-06-24 NOTE — Telephone Encounter (Signed)
*  STAT* If patient is at the pharmacy, call can be transferred to refill team.   1. Which medications need to be refilled? (please list name of each medication and dose if known) aspirin  EC 81 MG tablet   hydrochlorothiazide  (MICROZIDE ) 12.5 MG capsule   potassium chloride  (KLOR-CON  M) 10 MEQ tablet   2. Which pharmacy/location (including street and city if local pharmacy) is medication to be sent to? [facility] - [place],  - [address]   3. Do they need a 30 day or 90 day supply? 90 .

## 2023-06-24 NOTE — Telephone Encounter (Signed)
 Pt c/o swelling: STAT is pt has developed SOB within 24 hours  How much weight have you gained and in what time span? no  If swelling, where is the swelling located? Legs and ankles   Are you currently taking a fluid pill? no  Are you currently SOB? no  Do you have a log of your daily weights (if so, list)?    Have you gained 3 pounds in a day or 5 pounds in a week?    Have you traveled recently? No   Patient states that she doesn't feel good\. Please advise

## 2023-06-24 NOTE — Telephone Encounter (Signed)
 Returned call to patient-   Daughter states (ok to talk to per [facility]) mom called her this morning and states she just doesn't feel good. She states her ankles are pretty swollen- but this has happened before. Was on Torsemide but was changed to hydrochlorothiazide - but ankle swelling persists.

## 2023-06-24 NOTE — Telephone Encounter (Signed)
 S/w pt's daughter per [facility]) used two identifiers. Pt will take lasix one (1) tablet by mouth ( 20 mg) daily X 3 days with K-dur one (1) tablet by mouth ( 10 mEq) daily x 3 days.  Pt will weigh daily and limit salt intake.  Pt will keep legs elevated above heart when not sedentary. Pt will try moving more. Will call if wt is up 2 lbs in 24 hours or 5 lbs in one week.

## 2023-06-24 NOTE — Telephone Encounter (Signed)
 I believe she was previously on Lasix, but please verify. Would advise that she take Lasix and potassium for 3 days and monitor response. She should weigh herself each morning before eating or drinking anything and record those weights. Encouraged her to limit sodium to < 1800 mg daily and elevate legs above level of her heart when not moving around. She should aim to walk around throughout the day and not be sedentary for the majority of the day.

## 2023-07-04 ENCOUNTER — Ambulatory Visit (INDEPENDENT_AMBULATORY_CARE_PROVIDER_SITE_OTHER): Admitting: Nurse Practitioner

## 2023-07-04 ENCOUNTER — Encounter (HOSPITAL_BASED_OUTPATIENT_CLINIC_OR_DEPARTMENT_OTHER): Payer: Self-pay | Admitting: Nurse Practitioner

## 2023-07-04 ENCOUNTER — Other Ambulatory Visit (HOSPITAL_BASED_OUTPATIENT_CLINIC_OR_DEPARTMENT_OTHER): Payer: Self-pay | Admitting: *Deleted

## 2023-07-04 VITALS — BP 114/70 | HR 77 | Ht 62.0 in | Wt 203.0 lb

## 2023-07-04 DIAGNOSIS — R0602 Shortness of breath: Secondary | ICD-10-CM

## 2023-07-04 DIAGNOSIS — R931 Abnormal findings on diagnostic imaging of heart and coronary circulation: Secondary | ICD-10-CM | POA: Diagnosis not present

## 2023-07-04 DIAGNOSIS — E785 Hyperlipidemia, unspecified: Secondary | ICD-10-CM | POA: Diagnosis not present

## 2023-07-04 DIAGNOSIS — I25118 Atherosclerotic heart disease of native coronary artery with other forms of angina pectoris: Secondary | ICD-10-CM

## 2023-07-04 DIAGNOSIS — Z720 Tobacco use: Secondary | ICD-10-CM | POA: Diagnosis not present

## 2023-07-04 DIAGNOSIS — I10 Essential (primary) hypertension: Secondary | ICD-10-CM | POA: Diagnosis not present

## 2023-07-04 DIAGNOSIS — R600 Localized edema: Secondary | ICD-10-CM | POA: Diagnosis not present

## 2023-07-04 MED ORDER — ASPIRIN 81 MG PO TBEC: 81.0000 mg | DELAYED_RELEASE_TABLET | Freq: Every day | ORAL | 3 refills | Status: AC

## 2023-07-04 MED ORDER — SPIRONOLACTONE 25 MG PO TABS: 25.0000 mg | ORAL_TABLET | Freq: Every day | ORAL | 3 refills | Status: AC

## 2023-07-04 MED ORDER — ASPIRIN 81 MG PO TBEC: 81.0000 mg | DELAYED_RELEASE_TABLET | Freq: Every day | ORAL | Status: DC

## 2023-07-04 NOTE — Patient Instructions (Signed)
 Medication Instructions:   DISCONTINUE hydrochlorothiazide .  START Spirolactone one (1) tablet by mouth ( 25 mg) daily.     *If you need a refill on your cardiac medications before your next appointment, please call your pharmacy*  Lab Work:  Your physician recommends that you return for lab work in 10 days after starting new medication Aldactone.   Your physician recommends that you return for a FASTING LIPID/CMET, fasting after midnight, one week prior to your December appointment with Dr. Anner. Patient given paperwork today.    If you have labs (blood work) drawn today and your tests are completely normal, you will receive your results only by: MyChart Message (if you have MyChart) OR A paper copy in the mail If you have any lab test that is abnormal or we need to change your treatment, we will call you to review the results.  Testing/Procedures:  None ordered.  Follow-Up: At [facility], you and your health needs are our priority.  As part of our continuing mission to provide you with exceptional heart care, our providers are all part of one team.  This team includes your primary Cardiologist (physician) and Advanced Practice Providers or APPs (Physician Assistants and Nurse Practitioners) who all work together to provide you with the care you need, when you need it.  Your next appointment:   6 month(s)  Provider:   Alm Anner, MD    We recommend signing up for the patient portal called MyChart.  Sign up information is provided on this After Visit Summary.  MyChart is used to connect with patients for Virtual Visits (Telemedicine).  Patients are able to view lab/test results, encounter notes, upcoming appointments, etc.  Non-urgent messages can be sent to your provider as well.   To learn more about what you can do with MyChart, go to [email or web address].

## 2023-07-04 NOTE — Progress Notes (Signed)
 Cardiology Office Note:  .   Date:  07/04/2023 ID:  Tiffany Frye, DOB 02/25/1955, MRN [id number] PCP: Ransom Other, MD [facility] Providers Cardiologist:  None   Patient Profile: .      PMH Coronary artery disease CT calcium score 02/12/23 CAC score 4304 (99th percentile) LM 0, LAD 1637, LCx 865, RCA 1803 Aortic atherosclerosis Hypertension Hyperlipidemia COPD Tobacco dependence  Referred to cardiology and seen by me on 03/27/23 for elevated coronary calcium score., here with her daughter.  She reports CT calcium score was ordered based on calcification seen on lung cancer screening CT.  She reports shortness of breath when performing activities such as unloading groceries or doing housework.  This has been going on for quite a while and will resolve once she has rested for a few minutes. She denies chest pain or palpitations. Unfortunately, she continues to smoke.  She reports bilateral ankle swelling that initially occurred occasionally but has recently increased.  She was given a prescription for Lasix as needed by PCP, but she is taking it daily now.  She admits she does not engage in physical activity very often. She also skips meals for unclear reasons.  Her daughter does most of the cooking for her and reports she eats small portions.  She also gets a daytime meal from [employer].  Reports that BP has been well-controlled.  Family history is significant for her mother who had heart disease but per her report, this was later in life.  She occasionally drinks alcohol and soda but mostly drinks water.    Referred for cardiac cath due to abnormal cardiac PET CT and occasional shortness of breath. PET revealed reversible inferior perfusion defect consistent with ischemia, and stress EKG positive for ischemia. LHC 06/17/23 revealed heavily calcified multivessel disease with mid RCA to distal RCA 100% stenosed, proximal LAD 40%, mid LAD 30%, proximal Cx 40%, normal LV function, mildly  elevated LV EDP, no aortic valve stenosis.  Culprit lesion felt to be 100% CTO of RCA in the mid segment with modest collateralization to the PDA and PL system from septal perforators in the distal Cx.  Medical therapy recommended.  She called to report ankle swelling on 06/24/23 and was advised to limit sodium and take Lasix 20 mg x 3 days along with potassium supplement, and monitor weight.     History of Present Illness: .   Discussed the use of AI scribe software for clinical note transcription with the patient, who gave verbal consent to proceed.  History of Present Illness Tiffany Frye is a very pleasant 68 year old female who is here today for follow-up of CAD. She is here with her daughter. We discussed cardiac cath results and I further explained what a CTO is. Patient currently experiences no chest pain, dyspnea, or other symptoms concerning for angina.  She has leg swelling, which improved after a three-day course of Lasix.  She felt this improved the swelling but has some residual edema. She tries to remain active by walking around outside and visiting with neighbors. She denies orthopnea, PND, presyncope or syncope. Her cholesterol levels are well-controlled, with LDL in the sixties. She has coronary artery calcification, initially identified through a chest CT scan, leading to a stress test and subsequent catheterization.     No results found for: LIPOA   ROS: See HPI       Studies Reviewed: .           Risk Assessment/Calculations:  Physical Exam:   VS: BP 114/70   Pulse 77   Ht 5' 2 (1.575 m)   Wt 203 lb (92.1 kg)   SpO2 96%   BMI 37.13 kg/m   Wt Readings from Last 3 Encounters:  07/04/23 203 lb (92.1 kg)  06/12/23 206 lb (93.4 kg)  03/27/23 205 lb 9.6 oz (93.3 kg)     GEN: Well nourished, well developed in no acute distress NECK: No JVD; No carotid bruits CARDIAC: RRR, no murmurs, rubs, gallops RESPIRATORY:  Diminished breath sounds bilaterally  with expiratory wheezing. No rales or rhonchi  ABDOMEN: Soft, non-tender, non-distended EXTREMITIES:  No edema; No deformity     ASSESSMENT AND PLAN: .    CAD: Significantly elevated CAC of 4304 (99th percentile) with heavy calcification of LAD, LCx, and RCA.  Due to this high score and additional risk factors of hyperlipidemia, hypertension, and tobacco abuse, she underwent cardiac PET/CT which revealed reversible inferior perfusion defect consistent with ischemia, and stress EKG positive for ischemia. She underwent LHC 06/17/23 which revealed CTO of mid to distal RCA, proximal LAD 40%, mid LAD 30%, and proximal Cx 40% stenoses. Medical management encouraged.  I reviewed these findings in detail and answered questions to their satisfaction. She continues to have shortness of breath with walking moderate distances but no associated symptoms. Feels that SOB is chronic and stable. We discussed medical treatment of CAD. She is overall feeling well with no symptoms concerning for angina. We will continue GDMT including aspirin , losartan, atorvastatin, amlodipine. We are adding spironolactone as noted below.  Advised her to notify us  if symptoms worsen. Will plan follow-up with Dr. Anner, interventional cardiologist who performed LHC.   Shortness of breath: Shortness of breath ongoing for some time. She is a longtime smoker and has been diagnosed with COPD. SOB is not accompanied by chest pain or palpitations. No orthopnea or PND. Weight is stable. She has persistent leg edema which improved on a 3-day course of Lasix.  We will discontinue HCTZ and start spironolactone 25 mg once daily.  Will get BMET in 10 days to monitor renal function and potassium.  Hyperlipidemia LDL goal < 70: Lipid panel completed 04/11/2023 with total cholesterol 161, triglycerides 159, HDL 66, and LDL-C 68.  She had good improvement on atorvastatin 40 mg daily with LDL decreased from 91.  Advised it is reasonable to remain LDL goal < 55.   We will recheck lipids in 6 months prior to follow-up appointment.  Consider PCSK9 inhibitor if LDL is > 55.   Leg edema: Leg edema persists. She had improvement on Lasix for 3 days. Due to persistence of edema and elevated LVEDP on cath, we will d/c hydrochlorothiazide  and start spironolactone 25 mg daily.    Hypertension: BP is well controlled.  We will update labs to evaluate renal function today.  No change in antihypertensive therapy.  Tobacco abuse: Unfortunately she continues to smoke. She is not interested in quitting. Complete cessation advised.       Disposition: 6 months with Dr. Anner  Signed, Rosaline Bane, NP-C

## 2023-08-08 DIAGNOSIS — R931 Abnormal findings on diagnostic imaging of heart and coronary circulation: Secondary | ICD-10-CM | POA: Diagnosis not present

## 2023-08-08 DIAGNOSIS — E785 Hyperlipidemia, unspecified: Secondary | ICD-10-CM | POA: Diagnosis not present

## 2023-08-08 DIAGNOSIS — I25118 Atherosclerotic heart disease of native coronary artery with other forms of angina pectoris: Secondary | ICD-10-CM | POA: Diagnosis not present

## 2023-08-08 DIAGNOSIS — R0602 Shortness of breath: Secondary | ICD-10-CM | POA: Diagnosis not present

## 2023-08-09 LAB — LIPID PANEL
Chol/HDL Ratio: 2.6 ratio (ref 0.0–4.4)
Cholesterol, Total: 148 mg/dL (ref 100–199)
HDL: 57 mg/dL (ref >39)
LDL Chol Calc (NIH): 73 mg/dL (ref 0–99)
Triglycerides: 98 mg/dL (ref 0–149)
VLDL Cholesterol Cal: 18 mg/dL (ref 5–40)

## 2023-08-09 LAB — COMPREHENSIVE METABOLIC PANEL WITH GFR
ALT: 35 IU/L — ABNORMAL HIGH (ref 0–32)
AST: 38 IU/L (ref 0–40)
Albumin: 4.3 g/dL (ref 3.9–4.9)
Alkaline Phosphatase: 101 IU/L (ref 44–121)
BUN/Creatinine Ratio: 9 — ABNORMAL LOW (ref 12–28)
BUN: 10 mg/dL (ref 8–27)
Bilirubin Total: 0.3 mg/dL (ref 0.0–1.2)
CO2: 16 mmol/L — ABNORMAL LOW (ref 20–29)
Calcium: 9.5 mg/dL (ref 8.7–10.3)
Chloride: 108 mmol/L — ABNORMAL HIGH (ref 96–106)
Creatinine, Ser: 1.08 mg/dL — ABNORMAL HIGH (ref 0.57–1.00)
Globulin, Total: 2.6 g/dL (ref 1.5–4.5)
Glucose: 89 mg/dL (ref 70–99)
Potassium: 4.5 mmol/L (ref 3.5–5.2)
Sodium: 142 mmol/L (ref 134–144)
Total Protein: 6.9 g/dL (ref 6.0–8.5)
eGFR: 55 mL/min/1.73 — ABNORMAL LOW (ref >59)

## 2023-08-11 ENCOUNTER — Ambulatory Visit: Payer: Self-pay | Admitting: Nurse Practitioner

## 2023-08-14 ENCOUNTER — Other Ambulatory Visit (HOSPITAL_COMMUNITY): Payer: Self-pay

## 2023-08-14 ENCOUNTER — Telehealth: Payer: Self-pay | Admitting: Pharmacy Technician

## 2023-08-14 NOTE — Telephone Encounter (Signed)
 Pharmacy Patient Advocate Encounter   Received notification from Physician's Office that prior authorization for Nexletol  is required/requested.   Insurance verification completed.   The patient is insured through [employer] .   Per test claim: PA required; PA submitted to above mentioned insurance via CoverMyMeds Key/confirmation #/EOC [id number] Status is pending

## 2023-08-14 NOTE — Telephone Encounter (Signed)
 Pharmacy Patient Advocate Encounter   Received notification from Physician's Office that prior authorization for Repatha is required/requested.   Insurance verification completed.   The patient is insured through [employer] .   Per test claim: PA required; PA submitted to above mentioned insurance via CoverMyMeds Key/confirmation #/EOC [id number] Status is pending

## 2023-08-14 NOTE — Telephone Encounter (Signed)
 Pharmacy Patient Advocate Encounter  Received notification from [facility] that Prior Authorization for Repatha has been APPROVED from 08/14/23 to 02/14/24. Ran test claim, Copay is $0.00- one month. This test claim was processed through [facility]- copay amounts may vary at other pharmacies due to pharmacy/plan contracts, or as the patient moves through the different stages of their insurance plan.   PA #/Case ID/Reference #: [id number]

## 2023-08-14 NOTE — Telephone Encounter (Signed)
 Pharmacy Patient Advocate Encounter  Received notification from [facility] that Prior Authorization for Nexletol  has been APPROVED from 08/14/23 to 02/14/24. Ran test claim, Copay is $0.00- one month. This test claim was processed through [facility]- copay amounts may vary at other pharmacies due to pharmacy/plan contracts, or as the patient moves through the different stages of their insurance plan.   PA #/Case ID/Reference #: [id number]

## 2023-08-15 ENCOUNTER — Other Ambulatory Visit (HOSPITAL_BASED_OUTPATIENT_CLINIC_OR_DEPARTMENT_OTHER): Payer: Self-pay | Admitting: *Deleted

## 2023-08-15 DIAGNOSIS — I25118 Atherosclerotic heart disease of native coronary artery with other forms of angina pectoris: Secondary | ICD-10-CM

## 2023-08-15 DIAGNOSIS — R931 Abnormal findings on diagnostic imaging of heart and coronary circulation: Secondary | ICD-10-CM

## 2023-08-15 DIAGNOSIS — E785 Hyperlipidemia, unspecified: Secondary | ICD-10-CM

## 2023-08-15 MED ORDER — NEXLETOL 180 MG PO TABS: 180.0000 mg | ORAL_TABLET | Freq: Every day | ORAL | 3 refills | Status: AC

## 2023-08-15 NOTE — Telephone Encounter (Signed)
 S/w pt's daughter per [facility]) pt will start Nexletol  sent in to [facility].  Pt will come back the week before Dr. Anner appt in December and repeat fasting labs. Orders placed in system.

## 2023-08-15 NOTE — Telephone Encounter (Signed)
-----   Message from Tiffany Frye HERO sent at 08/14/2023 12:04 PM EDT ----- Alene have her start Nexletol  180 mg once daily in addition to atorvastatin. Recheck NMR prior to appointment in December with Dr. Anner.   Thank you. ----- Message ----- From: Tamea Edsel SAUNDERS Sent: 08/14/2023  11:33 AM EDT To: Frye HERO Percy, NP  Please advise.  Which one would you like pt to start. I t/w daughter yesterday of  course the tablet would be easier. ----- Message ----- From: Billy Camelia BIRCH, CPhT Sent: 08/14/2023  11:13 AM EDT To: Edsel SAUNDERS Tamea  Both approved and $0 copay for one month so both are reasonable ----- Message ----- From: Tamea Edsel SAUNDERS Sent: 08/13/2023  11:29 AM EDT To: Rx Prior Auth Team  Can we see how much the nexletol  or repatha would cost pt before I order this per Frye Tiffany PIETY.   Thanks Anahy Esh

## 2023-08-15 NOTE — Telephone Encounter (Signed)
-----   Message from Percy Rosaline HERO sent at 08/14/2023 12:04 PM EDT ----- Alene have her start Nexletol  180 mg once daily in addition to atorvastatin. Recheck NMR prior to appointment in December with Dr. Anner.   Thank you. ----- Message ----- From: Tamea Edsel SAUNDERS Sent: 08/14/2023  11:33 AM EDT To: Rosaline HERO Percy, NP  Please advise.  Which one would you like pt to start. I t/w daughter yesterday of  course the tablet would be easier. ----- Message ----- From: Billy Camelia BIRCH, CPhT Sent: 08/14/2023  11:13 AM EDT To: Edsel SAUNDERS Tamea  Both approved and $0 copay for one month so both are reasonable ----- Message ----- From: Tamea Edsel SAUNDERS Sent: 08/13/2023  11:29 AM EDT To: Rx Prior Auth Team  Can we see how much the nexletol  or repatha would cost pt before I order this per Rosaline Percy PIETY.   Thanks Anahy Esh

## 2023-08-15 NOTE — Telephone Encounter (Signed)
 S/w pt's daughter per (DPR), pt will start Nexletol  sent to requested pharmacy and get fasting labs per ov.

## 2023-08-27 DIAGNOSIS — E785 Hyperlipidemia, unspecified: Secondary | ICD-10-CM | POA: Diagnosis not present

## 2023-08-27 DIAGNOSIS — K219 Gastro-esophageal reflux disease without esophagitis: Secondary | ICD-10-CM | POA: Diagnosis not present

## 2023-08-27 DIAGNOSIS — J449 Chronic obstructive pulmonary disease, unspecified: Secondary | ICD-10-CM | POA: Diagnosis not present

## 2023-08-27 DIAGNOSIS — Z0000 Encounter for general adult medical examination without abnormal findings: Secondary | ICD-10-CM | POA: Diagnosis not present

## 2023-08-27 DIAGNOSIS — R7303 Prediabetes: Secondary | ICD-10-CM | POA: Diagnosis not present

## 2023-08-27 DIAGNOSIS — D696 Thrombocytopenia, unspecified: Secondary | ICD-10-CM | POA: Diagnosis not present

## 2023-08-27 DIAGNOSIS — Z23 Encounter for immunization: Secondary | ICD-10-CM | POA: Diagnosis not present

## 2023-08-27 DIAGNOSIS — F17210 Nicotine dependence, cigarettes, uncomplicated: Secondary | ICD-10-CM | POA: Diagnosis not present

## 2023-11-07 ENCOUNTER — Other Ambulatory Visit: Payer: Self-pay | Admitting: Internal Medicine

## 2023-11-07 DIAGNOSIS — Z1231 Encounter for screening mammogram for malignant neoplasm of breast: Secondary | ICD-10-CM

## 2023-11-29 ENCOUNTER — Ambulatory Visit
Admission: RE | Admit: 2023-11-29 | Discharge: 2023-11-29 | Disposition: A | Discharge status: Home / Self Care | Source: Ambulatory Visit | Attending: Internal Medicine | Admitting: Internal Medicine

## 2023-11-29 DIAGNOSIS — Z1231 Encounter for screening mammogram for malignant neoplasm of breast: Secondary | ICD-10-CM

## 2023-12-12 ENCOUNTER — Other Ambulatory Visit (HOSPITAL_BASED_OUTPATIENT_CLINIC_OR_DEPARTMENT_OTHER): Payer: Self-pay | Admitting: *Deleted

## 2023-12-12 DIAGNOSIS — I25118 Atherosclerotic heart disease of native coronary artery with other forms of angina pectoris: Secondary | ICD-10-CM

## 2023-12-12 DIAGNOSIS — E785 Hyperlipidemia, unspecified: Secondary | ICD-10-CM

## 2023-12-12 DIAGNOSIS — R931 Abnormal findings on diagnostic imaging of heart and coronary circulation: Secondary | ICD-10-CM

## 2023-12-12 LAB — BASIC METABOLIC PANEL WITH GFR
BUN/Creatinine Ratio: 19 (ref 12–28)
BUN: 19 mg/dL (ref 8–27)
CO2: 21 mmol/L (ref 20–29)
Calcium: 10.6 mg/dL — ABNORMAL HIGH (ref 8.7–10.3)
Chloride: 108 mmol/L — ABNORMAL HIGH (ref 96–106)
Creatinine, Ser: 1.01 mg/dL — ABNORMAL HIGH (ref 0.57–1.00)
Glucose: 98 mg/dL (ref 70–99)
Potassium: 4.2 mmol/L (ref 3.5–5.2)
Sodium: 144 mmol/L (ref 134–144)
eGFR: 60 mL/min/1.73 (ref >59)

## 2023-12-23 ENCOUNTER — Encounter: Payer: Self-pay | Admitting: Cardiology

## 2023-12-23 ENCOUNTER — Ambulatory Visit: Attending: Cardiology | Admitting: Cardiology

## 2023-12-23 VITALS — BP 130/62 | HR 79 | Ht 62.0 in | Wt 198.0 lb

## 2023-12-23 DIAGNOSIS — R5381 Other malaise: Secondary | ICD-10-CM | POA: Diagnosis not present

## 2023-12-23 DIAGNOSIS — I10 Essential (primary) hypertension: Secondary | ICD-10-CM | POA: Diagnosis not present

## 2023-12-23 DIAGNOSIS — E785 Hyperlipidemia, unspecified: Secondary | ICD-10-CM | POA: Diagnosis not present

## 2023-12-23 DIAGNOSIS — I25118 Atherosclerotic heart disease of native coronary artery with other forms of angina pectoris: Secondary | ICD-10-CM

## 2023-12-23 NOTE — Progress Notes (Unsigned)
 Cardiology Office Note:  .   Date:  12/23/2023  ID:  Tiffany Frye, DOB [birth year], MRN [id number] PCP: Ransom Other, MD  [facility] Providers Cardiologist:  Alm Clay, MD { Click to update primary MD,subspecialty MD or APP then REFRESH:1}    No chief complaint on file.   Patient Profile: .     Tiffany Frye is a *** 68 y.o. female *** with a PMH notable for *** who presents here for *** at the request of Ransom Other, MD.  [facility] Coronary artery disease CT calcium score 02/12/23 CAC score 4304 (99th percentile) LM 0, LAD 1637, LCx 865, RCA 1803 Cath = RCA CTO, mild LAD & LCx disease  Aortic atherosclerosis Hypertension Hyperlipidemia COPD Tobacco dependence     Tiffany Frye was last seen on ***  Subjective  Discussed the use of AI scribe software for clinical note transcription with the patient, who gave verbal consent to proceed.  History of Present Illness    Cardiovascular ROS: {roscv:310661}  ROS:  Review of Systems - {ros master:310782}    Objective    Studies Reviewed: SABRA        Lab Results  Component Value Date   CHOL 148 08/08/2023   HDL 57 08/08/2023   LDLCALC 73 08/08/2023   TRIG 98 08/08/2023   CHOLHDL 2.6 08/08/2023   Lab Results  Component Value Date   NA 144 12/11/2023   K 4.2 12/11/2023   CREATININE 1.01 (H) 12/11/2023   EGFR 60 12/11/2023   GLUCOSE 98 12/11/2023   Results   CATH: 06/17/2023   Heavily calcified multivessel disease:   Mid RCA to Dist RCA lesion is 100% stenosed.   Prox LAD lesion is 40% stenosed. Mid LAD lesion is 30% stenosed.   Prox Cx lesion is 40% stenosed.   The left ventricular systolic function is normal. LV end diastolic pressure is mildly elevated. There is no aortic valve stenosis.   Dominance: Right  CT: ***  Risk Assessment/Calculations:   {Does this patient have ATRIAL FIBRILLATION?:[phone]}           Physical Exam:   VS:  BP 130/62   Pulse 79   Ht 5' 2 (1.575 m)   Wt 198 lb  (89.8 kg)   SpO2 97%   BMI 36.21 kg/m    Wt Readings from Last 3 Encounters:  12/23/23 198 lb (89.8 kg)  07/04/23 203 lb (92.1 kg)  06/12/23 206 lb (93.4 kg)    Physical Exam    GEN: Well nourished, well developed in no acute distress; *** NECK: No JVD; No carotid bruits CARDIAC: Normal S1, S2; RRR, no murmurs, rubs, gallops RESPIRATORY:  Clear to auscultation without rales, wheezing or rhonchi ; nonlabored, good air movement. ABDOMEN: Soft, non-tender, non-distended EXTREMITIES:  No edema; No deformity      ASSESSMENT AND PLAN: .    Problem List Items Addressed This Visit   None   Assessment and Plan Assessment & Plan        {Are you ordering a CV Procedure (e.g. stress test, cath, DCCV, TEE, etc)?   Press F2        :[id number]}   Follow-Up: No follow-ups on file.  I spent *** minutes in the care of Tiffany Frye today including {CHL AMB CAR Time Based Billing Options STW (Optional):[phone]::documenting in the encounter.}      Signed, Alm MICAEL Clay, MD, MS Alm Clay, M.D., M.S. Interventional Cardiologist  [facility] Pager # [phone]

## 2023-12-23 NOTE — Patient Instructions (Addendum)
 Medication Instructions:   No changes  *If you need a refill on your cardiac medications before your next appointment, please call your pharmacy*   Lab Work: Not needed    Testing/Procedures:  Not needed  Follow-Up: At [facility], you and your health needs are our priority.  As part of our continuing mission to provide you with exceptional heart care, we have created designated Provider Care Teams.  These Care Teams include your primary Cardiologist (physician) and Advanced Practice Providers (APPs -  Physician Assistants and Nurse Practitioners) who all work together to provide you with the care you need, when you need it.     Your next appointment:   6 month(s)  The format for your next appointment:   In Person  Provider:   Rosaline Bane, NP   Other Instructions

## 2023-12-25 LAB — NMR, LIPOPROFILE
Cholesterol, Total: 166 mg/dL (ref 100–199)
HDL Particle Number: 51.1 umol/L (ref >=30.5)
HDL-C: 65 mg/dL (ref >39)
LDL Particle Number: 754 nmol/L (ref <1000)
LDL Size: 21 nm (ref >20.5)
LDL-C (NIH Calc): 78 mg/dL (ref 0–99)
LP-IR Score: 75 — ABNORMAL HIGH (ref <=45)
Small LDL Particle Number: 273 nmol/L (ref <=527)
Triglycerides: 133 mg/dL (ref 0–149)

## 2023-12-26 ENCOUNTER — Ambulatory Visit (HOSPITAL_BASED_OUTPATIENT_CLINIC_OR_DEPARTMENT_OTHER): Payer: Self-pay | Admitting: Nurse Practitioner

## 2023-12-27 ENCOUNTER — Encounter: Payer: Self-pay | Admitting: Cardiology

## 2023-12-27 DIAGNOSIS — R5381 Other malaise: Secondary | ICD-10-CM | POA: Insufficient documentation

## 2023-12-27 DIAGNOSIS — I2510 Atherosclerotic heart disease of native coronary artery without angina pectoris: Secondary | ICD-10-CM | POA: Insufficient documentation

## 2023-12-27 NOTE — Assessment & Plan Note (Signed)
 Cholesterol levels need monitoring to prevent coronary artery disease progression. Current treatment includes atorvastatin and Nexletol . Plan to optimize cholesterol levels to reduce future blockage risk. - Ordered full cholesterol panel. - Continue atorvastatin 40 mg daily. - Continue Nexletol  180 mg daily. - Consider adding ezetimibe if cholesterol levels remain above target.

## 2023-12-27 NOTE — Progress Notes (Signed)
 Pt has been made aware of normal result and verbalized understanding.  jw

## 2023-12-27 NOTE — Assessment & Plan Note (Signed)
 Blood pressure well-controlled. Target to maintain within range avoiding dizziness or syncope. - Continue losartan 50 mg daily, spironolactone  25 mg daily, along with amlodipine 10 mg daily. - Monitor blood pressure regularly. - Adjust losartan dose if blood pressure exceeds 140/90 mmHg.

## 2023-12-27 NOTE — Assessment & Plan Note (Signed)
 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 Stress test showed ischemia, normal pump function. Intervention not recommended due to risk and lack of significant symptoms. Focus on improving collateral circulation and controlling risk factors. - Continue aspirin  81 mg daily. => Low threshold to consider Plavix - Continue amlodipine 10 mg daily along with losartan 50 mg daily - Continue atorvastatin 40 daily along with Nexletol  180 mg daily - Encouraged physical activity to improve collateral circulation. - Monitor for symptoms of angina or heart failure. -Smoking cessation counseling

## 2023-12-27 NOTE — Assessment & Plan Note (Signed)
 Contributing to shortness of breath during exertion. Emphasis on gradual increase in physical activity to improve cardiovascular fitness and collateral circulation. - Encouraged gradual increase in physical activity, starting with 5 minutes, 3-4 times a day, building up to 30 minutes daily. - Recommended chair exercises or light walking as tolerated. - Advised to monitor for symptoms of angina during exercise.

## 2024-01-20 ENCOUNTER — Telehealth: Payer: Self-pay | Admitting: Pharmacy Technician

## 2024-01-20 NOTE — Telephone Encounter (Signed)
 Tiffany Frye
